# Patient Record
Sex: Male | Born: 1980 | Race: White | Hispanic: No | Marital: Single
Health system: Southern US, Community
[De-identification: ages and names within clinical notes are randomized; demographics above are authoritative.]

## PROBLEM LIST (undated history)

## (undated) DIAGNOSIS — F419 Anxiety disorder, unspecified: Secondary | ICD-10-CM

## (undated) DIAGNOSIS — E079 Disorder of thyroid, unspecified: Secondary | ICD-10-CM

## (undated) DIAGNOSIS — E039 Hypothyroidism, unspecified: Secondary | ICD-10-CM

---

## 2014-10-23 ENCOUNTER — Emergency Department (HOSPITAL_COMMUNITY)
Admission: EM | Admit: 2014-10-23 | Discharge: 2014-10-23 | Disposition: A | Discharge status: Home / Self Care | Payer: Self-pay | Attending: Emergency Medicine | Admitting: Emergency Medicine

## 2014-10-23 ENCOUNTER — Encounter (HOSPITAL_COMMUNITY): Payer: Self-pay | Admitting: Emergency Medicine

## 2014-10-23 DIAGNOSIS — Z8639 Personal history of other endocrine, nutritional and metabolic disease: Secondary | ICD-10-CM | POA: Insufficient documentation

## 2014-10-23 DIAGNOSIS — Z720 Tobacco use: Secondary | ICD-10-CM | POA: Insufficient documentation

## 2014-10-23 DIAGNOSIS — L03115 Cellulitis of right lower limb: Secondary | ICD-10-CM | POA: Insufficient documentation

## 2014-10-23 HISTORY — DX: Disorder of thyroid, unspecified: E07.9

## 2014-10-23 MED ORDER — CLINDAMYCIN HCL 150 MG PO CAPS: 450.0000 mg | ORAL_CAPSULE | Freq: Four times a day (QID) | ORAL | Status: DC

## 2014-10-23 NOTE — ED Notes (Signed)
Pt had poison oak on right lower leg, was exposed to MRSA by room mate, leg now red, swollen, yellow pus.

## 2014-10-23 NOTE — Discharge Instructions (Signed)
Abscess °An abscess (boil or furuncle) is an infected area on or under the skin. This area is filled with yellowish-white fluid (pus) and other material (debris). °HOME CARE  °· Only take medicines as told by your doctor. °· If you were given antibiotic medicine, take it as directed. Finish the medicine even if you start to feel better. °· If gauze is used, follow your doctor's directions for changing the gauze. °· To avoid spreading the infection: °¨ Keep your abscess covered with a bandage. °¨ Wash your hands well. °¨ Do not share personal care items, towels, or whirlpools with others. °¨ Avoid skin contact with others. °· Keep your skin and clothes clean around the abscess. °· Keep all doctor visits as told. °GET HELP RIGHT AWAY IF:  °· You have more pain, puffiness (swelling), or redness in the wound site. °· You have more fluid or blood coming from the wound site. °· You have muscle aches, chills, or you feel sick. °· You have a fever. °MAKE SURE YOU:  °· Understand these instructions. °· Will watch your condition. °· Will get help right away if you are not doing well or get worse. °Document Released: 07/05/2007 Document Revised: 07/18/2011 Document Reviewed: 03/31/2011 °[employer]® Patient Information ©2015 [employer]. This information is not intended to replace advice given to you by your health care provider. Make sure you discuss any questions you have with your health care provider. ° °

## 2014-10-23 NOTE — ED Provider Notes (Signed)
CSN: [id number]     Arrival date & time 10/23/14  2029 History  This chart was scribed for Melene Plan, DO by Phillis Haggis, ED Scribe. This patient was seen in room APA01/[facility]01 and patient care was started at 9:22 PM.   Chief Complaint  Patient presents with  . Wound Infection   The history is provided by the patient. No language interpreter was used.   HPI Comments: Tony Stephenson is a 34 y.o. male who presents to the Emergency Department complaining of a wound infection to the right lower leg onset two days ago. Pt states that he has poison oak to bilateral lower legs and noticed a "pimple" on top of the rash of his right lower leg. He states that he popped the whitehead and it has continued to hurt, swell, has redness, and yellow pus. Pt states that he has been using hydrogen peroxide and witch hazel on the area to no relief. Reports worsening pain with ambulation. Pt denies fever, chills, nausea or vomiting.   Past Medical History  Diagnosis Date  . Thyroid disease    History reviewed. No pertinent past surgical history. No family history on file. Social History  Substance Use Topics  . Smoking status: Heavy Tobacco Smoker -- 0.50 packs/day    Types: Cigarettes  . Smokeless tobacco: None  . Alcohol Use: No    Review of Systems  Constitutional: Negative for fever and chills.  HENT: Negative for congestion and rhinorrhea.   Eyes: Negative for redness and visual disturbance.  Respiratory: Negative for shortness of breath and wheezing.   Cardiovascular: Negative for chest pain and palpitations.  Gastrointestinal: Negative for nausea and vomiting.  Genitourinary: Negative for dysuria and urgency.  Musculoskeletal: Negative for myalgias and arthralgias.  Skin: Positive for rash and wound. Negative for pallor.  Neurological: Negative for dizziness and headaches.   Allergies  Review of patient's allergies indicates not on file.  Home Medications   Prior to Admission medications    Medication Sig Start Date End Date Taking? Authorizing Provider  clindamycin (CLEOCIN) 150 MG capsule Take 3 capsules (450 mg total) by mouth 4 (four) times daily. 10/23/14   Melene Plan, DO   BP 126/84 mmHg  Pulse 74  Temp(Src) 98.8 F (37.1 C) (Oral)  Resp 20  Ht  (1.727 m)  Wt 185 lb (83.915 kg)  BMI 28.14 kg/m2  SpO2 100% Physical Exam  Constitutional: She is oriented to person, place, and time. She appears well-developed and well-nourished.  HENT:  Head: Normocephalic and atraumatic.  Eyes: Conjunctivae and EOM are normal. Pupils are equal, round, and reactive to light.  Neck: Normal range of motion. Neck supple. No JVD present.  Cardiovascular: Normal rate, regular rhythm and normal heart sounds.  Exam reveals no gallop and no friction rub.   No murmur heard. Pulmonary/Chest: Effort normal and breath sounds normal. No respiratory distress. He has no wheezes.  Abdominal: Soft. He exhibits no distension. There is no tenderness. There is no rebound and no guarding.  Musculoskeletal: Normal range of motion.  serrous drainage, surrounding induration, draining ulceration to the lateral aspect of the proximal right calf, pulses, motor and sensation intact distally  Neurological: She is alert and oriented to person, place, and time.  Skin: Skin is warm and dry. No rash noted. No pallor.  Psychiatric: She has a normal mood and affect. Her behavior is normal.  Nursing note and vitals reviewed.   ED Course  Procedures (including critical care time) DIAGNOSTIC STUDIES:  Oxygen Saturation is 100% on RA, normal by my interpretation.    COORDINATION OF CARE: 9:23 PM-Discussed treatment plan which includes [place] of leg and anti-biotics with pt at bedside and pt agreed to plan.   Labs Review Labs Reviewed  CBC WITH DIFFERENTIAL/PLATELET  BASIC METABOLIC PANEL    Imaging Review No results found.    EKG Interpretation None      Emergency Focused Ultrasound Exam Limited  Ultrasound of Soft Tissue   Performed and interpreted by Dr. Adela Lank Indication: evaluation for infection or foreign body Transverse and Sagittal views of  are obtained in real time for the purposes of evaluation of skin and underlying soft tissues.  Findings: no heterogeneous fluid collection, with hyperemia/edema of surrounding tissue Interpretation: cellulitis Images archived electronically.  CPT Codes:   Lower extremity [id number]    MDM   Final diagnoses:  Cellulitis of right lower extremity    34 yo M with a chief complaint of skin infection. Sounds like probable abscess initially. Patient I&D to himself at home. Having swelling and drainage today. Denying fevers or chills. Bedside ultrasound without localized fluid collection. Will start on clindamycin. PCP follow-up. I personally performed the services described in this documentation, which was scribed in my presence. The recorded information has been reviewed and is accurate.    11:32 PM:  I have discussed the diagnosis/risks/treatment options with the patient and family and believe the pt to be eligible for discharge home to follow-up with PCP. We also discussed returning to the ED immediately if new or worsening sx occur. We discussed the sx which are most concerning (e.g., systemic symptoms) that necessitate immediate return. Medications administered to the patient during their visit and any new prescriptions provided to the patient are listed below.  Medications given during this visit Medications - No data to display  Discharge Medication List as of 10/23/2014 10:16 PM    START taking these medications   Details  clindamycin (CLEOCIN) 150 MG capsule Take 3 capsules (450 mg total) by mouth 4 (four) times daily., Starting 10/23/2014, Until Discontinued, Print         The patient appears reasonably screen and/or stabilized for discharge and I doubt any other medical condition or other [facility] requiring further screening,  evaluation, or treatment in the ED at this time prior to discharge.     Melene Plan, DO 10/23/14 2332

## 2015-07-05 ENCOUNTER — Encounter (HOSPITAL_COMMUNITY): Payer: Self-pay | Admitting: Emergency Medicine

## 2015-07-05 ENCOUNTER — Emergency Department (HOSPITAL_COMMUNITY)
Admission: EM | Admit: 2015-07-05 | Discharge: 2015-07-05 | Disposition: A | Discharge status: Home / Self Care | Payer: 59 | Attending: Emergency Medicine | Admitting: Emergency Medicine

## 2015-07-05 DIAGNOSIS — Z792 Long term (current) use of antibiotics: Secondary | ICD-10-CM | POA: Insufficient documentation

## 2015-07-05 DIAGNOSIS — L03116 Cellulitis of left lower limb: Secondary | ICD-10-CM

## 2015-07-05 DIAGNOSIS — Z87891 Personal history of nicotine dependence: Secondary | ICD-10-CM | POA: Diagnosis not present

## 2015-07-05 DIAGNOSIS — L02416 Cutaneous abscess of left lower limb: Secondary | ICD-10-CM | POA: Diagnosis present

## 2015-07-05 MED ORDER — CEPHALEXIN 500 MG PO CAPS: 500.0000 mg | ORAL_CAPSULE | Freq: Four times a day (QID) | ORAL | Status: DC

## 2015-07-05 MED ORDER — SULFAMETHOXAZOLE-TRIMETHOPRIM 800-160 MG PO TABS: 1.0000 | ORAL_TABLET | Freq: Two times a day (BID) | ORAL | Status: DC

## 2015-07-05 NOTE — ED Notes (Signed)
Pt c/o painful abscess superior to left knee onset 3 days ago. Yellow purulent open area approximately 1 cm diameter surrounded by hyperthermic erythematous skin approximately 10x6 cm. No fevers, chills, nausea, emesis.

## 2015-07-05 NOTE — ED Provider Notes (Signed)
CSN: [id number]     Arrival date & time 07/05/15  0903 History   First MD Initiated Contact with Patient 07/05/15 [phone]     Chief Complaint  Patient presents with  . Abscess     (Consider location/radiation/quality/duration/timing/severity/associated sxs/prior Treatment) Patient is a 35 y.o. male presenting with abscess. The history is provided by the patient.  Abscess Location:  Leg Leg abscess location:  L upper leg Abscess quality: draining   Red streaking: no   Duration:  2 days Progression:  Worsening Chronicity:  Recurrent Relieved by:  Nothing Worsened by:  Nothing tried Ineffective treatments:  None tried Associated symptoms: no fever, no headaches and no vomiting    35 yo M With a chief complaint of an abscess. The started on his leg couple days ago. Has been draining a little bit. Denies fevers or chills denies nausea. Has a history of a similar lesion in the past about 6 months ago. Denies any injury to the area. Thinks it started as an ingrown hair.  Past Medical History  Diagnosis Date  . Thyroid disease     Hypothyroidism   History reviewed. No pertinent past surgical history. History reviewed. No pertinent family history. Social History  Substance Use Topics  . Smoking status: Former Smoker -- 0.00 packs/day  . Smokeless tobacco: None  . Alcohol Use: No    Review of Systems  Constitutional: Negative for fever and chills.  HENT: Negative for congestion and facial swelling.   Eyes: Negative for discharge and visual disturbance.  Respiratory: Negative for shortness of breath.   Cardiovascular: Negative for chest pain and palpitations.  Gastrointestinal: Negative for vomiting, abdominal pain and diarrhea.  Musculoskeletal: Negative for myalgias and arthralgias.  Skin: Negative for color change and rash.  Neurological: Negative for tremors, syncope and headaches.  Psychiatric/Behavioral: Negative for confusion and dysphoric mood.      Allergies  Review  of patient's allergies indicates no known allergies.  Home Medications   Prior to Admission medications   Medication Sig Start Date End Date Taking? Authorizing Provider  cephALEXin (KEFLEX) 500 MG capsule Take 1 capsule (500 mg total) by mouth 4 (four) times daily. 07/05/15   Melene Planan Mikenzie Mccannon, DO  clindamycin (CLEOCIN) 150 MG capsule Take 3 capsules (450 mg total) by mouth 4 (four) times daily. 10/23/14   Melene Planan Casper Pagliuca, DO  sulfamethoxazole-trimethoprim (BACTRIM DS,SEPTRA DS) 800-160 MG tablet Take 1 tablet by mouth 2 (two) times daily. 07/05/15   Melene Planan Alvetta Hidrogo, DO   BP 119/83 mmHg  Pulse 90  Temp(Src) 98.2 F (36.8 C) (Oral)  Resp 18  Ht 5\' 8"  (1.727 m)  Wt 200 lb (90.719 kg)  BMI 30.42 kg/m2  SpO2 97% Physical Exam  Constitutional: He is oriented to person, place, and time. He appears well-developed and well-nourished.  HENT:  Head: Normocephalic and atraumatic.  Eyes: EOM are normal. Pupils are equal, round, and reactive to light.  Neck: Normal range of motion. Neck supple. No JVD present.  Cardiovascular: Normal rate and regular rhythm.  Exam reveals no gallop and no friction rub.   No murmur heard. Pulmonary/Chest: No respiratory distress. He has no wheezes.  Abdominal: He exhibits no distension. There is no rebound and no guarding.  Musculoskeletal: Normal range of motion. He exhibits edema.  About 3 finger breaths above the left patella there is significant induration with surrounding erythema. Is about the size of a salad plate. No noted fluctuance  Neurological: He is alert and oriented to person, place, and time.  Skin: No rash noted. No pallor.  Psychiatric: He has a normal mood and affect. His behavior is normal.  Nursing note and vitals reviewed.   ED Course  Procedures (including critical care time) Labs Review Labs Reviewed - No data to display  Imaging Review No results found. I have personally reviewed and evaluated these images and lab results as part of my medical  decision-making.   EKG Interpretation None      Emergency Focused Ultrasound Exam Limited Ultrasound of Soft Tissue   Performed and interpreted by Dr. Adela Lank Indication: evaluation for infection or foreign body Transverse and Sagittal views of left upper leg are obtained in real time for the purposes of evaluation of skin and underlying soft tissues.  Findings: no heterogeneous fluid collection, with hyperemia/edema of surrounding tissue Interpretation: no abscess, with cellulitis Images archived electronically.  CPT Codes:   Lower extremity [id number]    MDM   Final diagnoses:  Cellulitis of left lower extremity    35 yo M with a chief complaint of cellulitis to the left lower extremity. Bedside ultrasound with no drainable fluid collection. We'll have him use warm compresses. PCP follow-up.  10:01 AM:  I have discussed the diagnosis/risks/treatment options with the patient and believe the pt to be eligible for discharge home to follow-up with PCP. We also discussed returning to the ED immediately if new or worsening sx occur. We discussed the sx which are most concerning (e.g., sudden worsening pain, fever, inability to tolerate by mouth) that necessitate immediate return. Medications administered to the patient during their visit and any new prescriptions provided to the patient are listed below.  Medications given during this visit Medications - No data to display  New Prescriptions   CEPHALEXIN (KEFLEX) 500 MG CAPSULE    Take 1 capsule (500 mg total) by mouth 4 (four) times daily.   SULFAMETHOXAZOLE-TRIMETHOPRIM (BACTRIM DS,SEPTRA DS) 800-160 MG TABLET    Take 1 tablet by mouth 2 (two) times daily.    The patient appears reasonably screen and/or stabilized for discharge and I doubt any other medical condition or other [facility] requiring further screening, evaluation, or treatment in the ED at this time prior to discharge.    Melene Plan, DO 07/05/15 1001

## 2015-07-05 NOTE — Discharge Instructions (Signed)

## 2017-08-21 ENCOUNTER — Encounter (HOSPITAL_COMMUNITY): Payer: Self-pay | Admitting: Emergency Medicine

## 2017-08-21 ENCOUNTER — Emergency Department (HOSPITAL_COMMUNITY)
Admission: EM | Admit: 2017-08-21 | Discharge: 2017-08-21 | Disposition: A | Discharge status: Home / Self Care | Payer: 59 | Attending: Emergency Medicine | Admitting: Emergency Medicine

## 2017-08-21 DIAGNOSIS — Z87891 Personal history of nicotine dependence: Secondary | ICD-10-CM | POA: Insufficient documentation

## 2017-08-21 DIAGNOSIS — R1110 Vomiting, unspecified: Secondary | ICD-10-CM | POA: Diagnosis not present

## 2017-08-21 DIAGNOSIS — Z79899 Other long term (current) drug therapy: Secondary | ICD-10-CM | POA: Diagnosis not present

## 2017-08-21 DIAGNOSIS — E039 Hypothyroidism, unspecified: Secondary | ICD-10-CM | POA: Insufficient documentation

## 2017-08-21 DIAGNOSIS — N179 Acute kidney failure, unspecified: Secondary | ICD-10-CM | POA: Diagnosis not present

## 2017-08-21 DIAGNOSIS — R109 Unspecified abdominal pain: Secondary | ICD-10-CM | POA: Insufficient documentation

## 2017-08-21 HISTORY — DX: Anxiety disorder, unspecified: F41.9

## 2017-08-21 HISTORY — DX: Hypothyroidism, unspecified: E03.9

## 2017-08-21 LAB — CBC
HCT: 43.6 % (ref 39.0–52.0)
HEMOGLOBIN: 14.1 g/dL (ref 13.0–17.0)
MCH: 29.7 pg (ref 26.0–34.0)
MCHC: 32.3 g/dL (ref 30.0–36.0)
MCV: 91.8 fL (ref 78.0–100.0)
Platelets: 239 10*3/uL (ref 150–400)
RBC: 4.75 MIL/uL (ref 4.22–5.81)
RDW: 13.2 % (ref 11.5–15.5)
WBC: 9.4 10*3/uL (ref 4.0–10.5)

## 2017-08-21 LAB — URINALYSIS, ROUTINE W REFLEX MICROSCOPIC
Bilirubin Urine: NEGATIVE
Glucose, UA: NEGATIVE mg/dL
Hgb urine dipstick: NEGATIVE
KETONES UR: NEGATIVE mg/dL
Nitrite: NEGATIVE
PROTEIN: NEGATIVE mg/dL
Specific Gravity, Urine: 1.015 (ref 1.005–1.030)
pH: 5 (ref 5.0–8.0)

## 2017-08-21 LAB — COMPREHENSIVE METABOLIC PANEL
ALBUMIN: 4.3 g/dL (ref 3.5–5.0)
ALT: 23 U/L (ref 0–44)
ANION GAP: 9 (ref 5–15)
AST: 22 U/L (ref 15–41)
Alkaline Phosphatase: 47 U/L (ref 38–126)
BUN: 10 mg/dL (ref 6–20)
CHLORIDE: 106 mmol/L (ref 98–111)
CO2: 25 mmol/L (ref 22–32)
CREATININE: 1.71 mg/dL — AB (ref 0.61–1.24)
Calcium: 9.3 mg/dL (ref 8.9–10.3)
GFR calc Af Amer: 57 mL/min — ABNORMAL LOW (ref >60)
GFR calc non Af Amer: 49 mL/min — ABNORMAL LOW (ref >60)
Glucose, Bld: 110 mg/dL — ABNORMAL HIGH (ref 70–99)
Potassium: 3.6 mmol/L (ref 3.5–5.1)
SODIUM: 140 mmol/L (ref 135–145)
Total Bilirubin: 0.8 mg/dL (ref 0.3–1.2)
Total Protein: 6.9 g/dL (ref 6.5–8.1)

## 2017-08-21 LAB — LIPASE, BLOOD: LIPASE: 27 U/L (ref 11–51)

## 2017-08-21 MED ORDER — ONDANSETRON 4 MG PO TBDP
4.0000 mg | ORAL_TABLET | Freq: Once | ORAL | Status: AC | PRN
  Administered 2017-08-21: 4 mg via ORAL
  Filled 2017-08-21: qty 1

## 2017-08-21 MED ORDER — SODIUM CHLORIDE 0.9 % IV BOLUS: 1000.0000 mL | Freq: Once | INTRAVENOUS | Status: DC

## 2017-08-21 MED ORDER — ONDANSETRON 4 MG PO TBDP: 4.0000 mg | ORAL_TABLET | Freq: Three times a day (TID) | ORAL | 0 refills | Status: AC | PRN

## 2017-08-21 NOTE — ED Notes (Signed)
Pt stable, ambulatory, states understanding of discharge instructions 

## 2017-08-21 NOTE — Discharge Instructions (Signed)
Your kidney function was elevated today, you have refused IV fluids in the emergency department.  Please be sure to drink plenty of water to help improve your kidney function and follow-up with your primary doctor for further evaluation. Please follow-up with your primary care provider as soon as possible for further evaluation. Please return to the emergency department for any new or worsening symptoms. You may use the Zofran prescribed for nausea as needed. You may use over-the-counter anti-inflammatory medication such as Tylenol as needed for your pain.  Contact a health care provider if: Your pain is not controlled with medicine. You have new symptoms. Your pain gets worse. You have a fever. Your symptoms last longer than 2-3 days. You have trouble urinating or you are urinating very frequently. Get help right away if: You have trouble breathing or you are short of breath. Your abdomen hurts or it is swollen or red. You have nausea or vomiting. You feel faint or you pass out. You have blood in your urine. Contact a doctor if: You have a fever. You cannot keep fluids down. Your symptoms get worse. You have new symptoms. You feel sick to your stomach for more than two days. You feel light-headed or dizzy. You have a headache. You have muscle cramps. Get help right away if: You have pain in your chest, neck, arm, or jaw. You feel very weak or you pass out (faint). You throw up again and again. You see blood in your throw-up. Your throw-up looks like black coffee grounds. You have bloody or black poop (stools) or poop that look like tar. You have a very bad headache, a stiff neck, or both. You have a rash. You have very bad pain, cramping, or bloating in your belly (abdomen). You have trouble breathing. You are breathing very quickly. Your heart is beating very quickly. Your skin feels cold and clammy. You feel confused. You have pain when you pee. You have signs of  dehydration, such as: Dark pee, hardly any pee, or no pee. Cracked lips. Dry mouth. Sunken eyes. Sleepiness. Weakness.

## 2017-08-21 NOTE — ED Notes (Signed)
Pt refused IV and IV fluids at this time.  States he can drink water just fine.

## 2017-08-21 NOTE — ED Triage Notes (Signed)
Patient complains of intermittent stabbing left sided abdominal pain for the last two weeks. States pain has gotten progressively worse, states pain was too severe this morning so patient came in for evaluation. Patient alert, oriented, and ambulating independently with steady gait.

## 2017-08-21 NOTE — ED Provider Notes (Signed)
[facility] EMERGENCY DEPARTMENT Provider Note   CSN: [id number] Arrival date & time: 08/21/17  1008     History   Chief Complaint Chief Complaint  Patient presents with  . Abdominal Pain    HPI Tony Stephenson is a 37 y.o. male presenting for left-sided abdominal pain that began yesterday morning at 3:30 AM.  Patient describes his pain as a throbbing that begins in his left side and radiates to his back.  Patient states that the pain lasted approximately 2 hours and then he went to sleep and when he awoke without pain.  Patient states that his left sided abdominal pain has not returned since the initial event.  Patient also endorses intermittent vomiting for the past 2 weeks, he states that he has vomited approximately 4 times over the past 2 weeks, nonbloody and nonbilious.  Patient endorses a generalized abdominal pain that he attributes to his vomiting.  Patient states that he last vomited when the pain occurred 2 nights ago.  Patient states that he has eaten food and drink water without nausea or vomiting in the last 24 hours.  Patient denies fever, diarrhea, hematuria, penile discharge, testicular swelling or pain.    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 Patient denies personal or family history of kidney stones.  Past Medical History:  Diagnosis Date  . Anxiety   . Hypothyroidism   . Thyroid disease    Hypothyroidism    There are no active problems to display for this patient.   History reviewed. No pertinent surgical history.      Home Medications    Prior to Admission medications   Medication Sig Start Date End Date Taking? Authorizing Provider  cephALEXin (KEFLEX) 500 MG capsule Take 1 capsule (500 mg total) by mouth 4 (four) times daily. 07/05/15   Melene Plan, DO  clindamycin (CLEOCIN) 150 MG capsule Take 3 capsules (450 mg total) by mouth 4 (four) times daily. 10/23/14    Melene Plan, DO  sulfamethoxazole-trimethoprim (BACTRIM DS,SEPTRA DS) 800-160 MG tablet Take 1 tablet by mouth 2 (two) times daily. 07/05/15   Melene Plan, DO    Family History No family history on file.  Social History Social History   Tobacco Use  . Smoking status: Former Smoker    Packs/day: 0.00  Substance Use Topics  . Alcohol use: No  . Drug use: No     Allergies   Patient has no known allergies.   Review of Systems Review of Systems  Constitutional: Negative.  Negative for chills, fatigue and fever.  HENT: Negative.  Negative for rhinorrhea, sore throat and trouble swallowing.   Eyes: Negative.  Negative for visual disturbance.  Respiratory: Negative.  Negative for cough, chest tightness and shortness of breath.   Cardiovascular: Negative.  Negative for chest pain and leg swelling.  Gastrointestinal: Positive for abdominal pain, nausea and vomiting. Negative for blood in stool and diarrhea.  Genitourinary: Negative.  Negative for discharge, dysuria, flank pain, hematuria, scrotal swelling and testicular pain.  Musculoskeletal: Negative.  Negative for arthralgias, myalgias and neck pain.  Skin: Negative.  Negative for rash.  Neurological: Negative.  Negative for dizziness, syncope, weakness, light-headedness and headaches.     Physical Exam Updated Vital Signs BP 123/69   Pulse 70   Temp 99 F (37.2 C) (Oral)   Resp 17   Ht 5\' 8"  (1.727 m)   Wt 97.5 kg (215 lb)  SpO2 95%   BMI 32.69 kg/m   Physical Exam  Constitutional: He is oriented to person, place, and time. He appears well-developed and well-nourished. No distress.  HENT:  Head: Normocephalic and atraumatic.  Eyes: Pupils are equal, round, and reactive to light. EOM are normal.  Neck: Normal range of motion. Neck supple.  Cardiovascular: Normal rate, regular rhythm, normal heart sounds and intact distal pulses.  Pulmonary/Chest: Effort normal and breath sounds normal. No respiratory distress.    Abdominal: Soft. Normal appearance and bowel sounds are normal. He exhibits no distension. There is no tenderness. There is no rigidity, no rebound, no guarding, no CVA tenderness, no tenderness at McBurney's point and negative Murphy's sign.    Genitourinary:  Genitourinary Comments: Deferred by patient  Neurological: He is alert and oriented to person, place, and time.  Skin: Skin is warm and dry. Capillary refill takes less than 2 seconds.  Psychiatric: He has a normal mood and affect. His behavior is normal.     ED Treatments / Results  Labs (all labs ordered are listed, but only abnormal results are displayed) Labs Reviewed  COMPREHENSIVE METABOLIC PANEL - Abnormal; Notable for the following components:      Result Value   Glucose, Bld 110 (*)    Creatinine, Ser 1.71 (*)    GFR calc non Af Amer 49 (*)    GFR calc Af Amer 57 (*)    All other components within normal limits  LIPASE, BLOOD  CBC  URINALYSIS, ROUTINE W REFLEX MICROSCOPIC    EKG None  Radiology No results found.  Procedures Procedures (including critical care time)  Medications Ordered in ED Medications  sodium chloride 0.9 % bolus 1,000 mL (has no administration in time range)  ondansetron (ZOFRAN-ODT) disintegrating tablet 4 mg (4 mg Oral Given 08/21/17 1035)     Initial Impression / Assessment and Plan / ED Course  I have reviewed the triage vital signs and the nursing notes.  Pertinent labs & imaging results that were available during my care of the patient were reviewed by me and considered in my medical decision making (see chart for details).  Clinical Course as of Aug 21 1844  Tue Aug 21, 2017  1400 Patient is refusing IV fluids, states that he would rather replenish orally.  I have informed the patient that his kidney function was elevated today and that I recommended IV fluids, he still refused.   [BM]    Clinical Course User Index [BM] Bill SalinasMorelli, Lemar Bakos A, PA-C   Patient presenting  with abdominal pain. Patient is afebrile, non-toxic appearing, sitting comfortably on examination table.  Patient denying pain at this time, patient refused fluid bolus in the department.  Patient's labs showed a creatinine of 1.71 today, I advised patient to accept the fluid bolus, he declined and stated that he just wanted to continue drinking water, I have informed the patient that he needs to follow-up with his primary care provider for further evaluation to make sure that his increased creatinine level resolves. Patient does not meet the SIRS or Sepsis criteria.  On repeat exam patient does not have a surgical abdomen and there are no peritoneal signs.  No indication of appendicitis, bowel obstruction, bowel perforation, cholecystitis, diverticulitis.  It is possible that the patient's vomiting and pain are related to a possible kidney stone.  I have encouraged patient to follow-up with his primary care provider for further evaluation.  Patient with no signs of infection, no pain at this time,  tolerating oral liquids and solids without difficulty, patient without CVA tenderness, ambulating well and requesting discharge.  I have informed the patient of his need for further evaluation through his primary care provider and he states understanding and states that he will follow-up with his primary care provider as soon as he can.  At this time there does not appear to be any evidence of an acute emergency medical condition and the patient appears stable for discharge with appropriate outpatient follow up. Diagnosis was discussed with patient who verbalizes understanding and is agreeable to discharge. I have discussed return precautions with patient who verbalize understanding of return precautions. Patient strongly encouraged to follow-up with their PCP.  Patient's case discussed with Dr. Adela Lank who agrees with plan to discharge with follow-up.     This note was dictated using DragonOne dictation software;  please contact for any inconsistencies within the note.     Final Clinical Impressions(s) / ED Diagnoses   Final diagnoses:  None    ED Discharge Orders    None       Bill Salinas, PA-C 08/21/17 1858    Melene Plan, DO 08/21/17 2046

## 2018-09-30 ENCOUNTER — Emergency Department (HOSPITAL_COMMUNITY): Payer: No Typology Code available for payment source

## 2018-09-30 ENCOUNTER — Inpatient Hospital Stay (HOSPITAL_COMMUNITY): Payer: No Typology Code available for payment source

## 2018-09-30 ENCOUNTER — Inpatient Hospital Stay (HOSPITAL_COMMUNITY)
Admission: EM | Admit: 2018-09-30 | Discharge: 2018-12-01 | DRG: 551 | Disposition: E | Payer: No Typology Code available for payment source | Attending: General Surgery | Admitting: General Surgery

## 2018-09-30 DIAGNOSIS — R578 Other shock: Secondary | ICD-10-CM | POA: Diagnosis not present

## 2018-09-30 DIAGNOSIS — Y92410 Unspecified street and highway as the place of occurrence of the external cause: Secondary | ICD-10-CM | POA: Diagnosis not present

## 2018-09-30 DIAGNOSIS — J9601 Acute respiratory failure with hypoxia: Secondary | ICD-10-CM | POA: Diagnosis present

## 2018-09-30 DIAGNOSIS — S40212A Abrasion of left shoulder, initial encounter: Secondary | ICD-10-CM | POA: Diagnosis present

## 2018-09-30 DIAGNOSIS — S20229A Contusion of unspecified back wall of thorax, initial encounter: Secondary | ICD-10-CM | POA: Diagnosis not present

## 2018-09-30 DIAGNOSIS — S41039A Puncture wound without foreign body of unspecified shoulder, initial encounter: Secondary | ICD-10-CM | POA: Diagnosis present

## 2018-09-30 DIAGNOSIS — E872 Acidosis: Secondary | ICD-10-CM | POA: Diagnosis present

## 2018-09-30 DIAGNOSIS — D62 Acute posthemorrhagic anemia: Secondary | ICD-10-CM | POA: Diagnosis present

## 2018-09-30 DIAGNOSIS — R402310 Coma scale, best motor response, none, unspecified time: Secondary | ICD-10-CM | POA: Diagnosis present

## 2018-09-30 DIAGNOSIS — J15212 Pneumonia due to Methicillin resistant Staphylococcus aureus: Secondary | ICD-10-CM | POA: Diagnosis not present

## 2018-09-30 DIAGNOSIS — Z515 Encounter for palliative care: Secondary | ICD-10-CM | POA: Diagnosis not present

## 2018-09-30 DIAGNOSIS — G8250 Quadriplegia, unspecified: Secondary | ICD-10-CM | POA: Diagnosis present

## 2018-09-30 DIAGNOSIS — R069 Unspecified abnormalities of breathing: Secondary | ICD-10-CM

## 2018-09-30 DIAGNOSIS — S12300A Unspecified displaced fracture of fourth cervical vertebra, initial encounter for closed fracture: Secondary | ICD-10-CM | POA: Diagnosis present

## 2018-09-30 DIAGNOSIS — I462 Cardiac arrest due to underlying cardiac condition: Secondary | ICD-10-CM | POA: Diagnosis not present

## 2018-09-30 DIAGNOSIS — S129XXA Fracture of neck, unspecified, initial encounter: Principal | ICD-10-CM | POA: Diagnosis present

## 2018-09-30 DIAGNOSIS — E876 Hypokalemia: Secondary | ICD-10-CM | POA: Diagnosis present

## 2018-09-30 DIAGNOSIS — I469 Cardiac arrest, cause unspecified: Secondary | ICD-10-CM

## 2018-09-30 DIAGNOSIS — R0902 Hypoxemia: Secondary | ICD-10-CM

## 2018-09-30 DIAGNOSIS — E870 Hyperosmolality and hypernatremia: Secondary | ICD-10-CM | POA: Diagnosis not present

## 2018-09-30 DIAGNOSIS — W3400XA Accidental discharge from unspecified firearms or gun, initial encounter: Secondary | ICD-10-CM | POA: Diagnosis not present

## 2018-09-30 DIAGNOSIS — S41032A Puncture wound without foreign body of left shoulder, initial encounter: Secondary | ICD-10-CM | POA: Diagnosis present

## 2018-09-30 DIAGNOSIS — R402210 Coma scale, best verbal response, none, unspecified time: Secondary | ICD-10-CM | POA: Diagnosis present

## 2018-09-30 DIAGNOSIS — Z23 Encounter for immunization: Secondary | ICD-10-CM | POA: Diagnosis not present

## 2018-09-30 DIAGNOSIS — R339 Retention of urine, unspecified: Secondary | ICD-10-CM | POA: Diagnosis present

## 2018-09-30 DIAGNOSIS — J189 Pneumonia, unspecified organism: Secondary | ICD-10-CM

## 2018-09-30 DIAGNOSIS — L8990 Pressure ulcer of unspecified site, unspecified stage: Secondary | ICD-10-CM | POA: Diagnosis not present

## 2018-09-30 DIAGNOSIS — J80 Acute respiratory distress syndrome: Secondary | ICD-10-CM

## 2018-09-30 DIAGNOSIS — J9690 Respiratory failure, unspecified, unspecified whether with hypoxia or hypercapnia: Secondary | ICD-10-CM

## 2018-09-30 DIAGNOSIS — R402110 Coma scale, eyes open, never, unspecified time: Secondary | ICD-10-CM | POA: Diagnosis present

## 2018-09-30 DIAGNOSIS — Z01818 Encounter for other preprocedural examination: Secondary | ICD-10-CM

## 2018-09-30 DIAGNOSIS — Z9289 Personal history of other medical treatment: Secondary | ICD-10-CM

## 2018-09-30 DIAGNOSIS — J156 Pneumonia due to other aerobic Gram-negative bacteria: Secondary | ICD-10-CM | POA: Diagnosis present

## 2018-09-30 DIAGNOSIS — Y249XXA Unspecified firearm discharge, undetermined intent, initial encounter: Secondary | ICD-10-CM

## 2018-09-30 DIAGNOSIS — Z20828 Contact with and (suspected) exposure to other viral communicable diseases: Secondary | ICD-10-CM | POA: Diagnosis present

## 2018-09-30 DIAGNOSIS — K567 Ileus, unspecified: Secondary | ICD-10-CM

## 2018-09-30 DIAGNOSIS — Z79890 Hormone replacement therapy (postmenopausal): Secondary | ICD-10-CM

## 2018-09-30 DIAGNOSIS — Z79899 Other long term (current) drug therapy: Secondary | ICD-10-CM

## 2018-09-30 DIAGNOSIS — R001 Bradycardia, unspecified: Secondary | ICD-10-CM | POA: Diagnosis not present

## 2018-09-30 DIAGNOSIS — Z4659 Encounter for fitting and adjustment of other gastrointestinal appliance and device: Secondary | ICD-10-CM

## 2018-09-30 DIAGNOSIS — Z0189 Encounter for other specified special examinations: Secondary | ICD-10-CM

## 2018-09-30 LAB — CBC
HCT: 40.7 % (ref 39.0–52.0)
Hemoglobin: 12.8 g/dL — ABNORMAL LOW (ref 13.0–17.0)
MCH: 30.7 pg (ref 26.0–34.0)
MCHC: 31.4 g/dL (ref 30.0–36.0)
MCV: 97.6 fL (ref 80.0–100.0)
Platelets: 235 K/uL (ref 150–400)
RBC: 4.17 MIL/uL — ABNORMAL LOW (ref 4.22–5.81)
RDW: 13 % (ref 11.5–15.5)
WBC: 13.6 K/uL — ABNORMAL HIGH (ref 4.0–10.5)
nRBC: 0 % (ref 0.0–0.2)

## 2018-09-30 LAB — COMPREHENSIVE METABOLIC PANEL
ALT: 44 U/L (ref 0–44)
AST: 47 U/L — ABNORMAL HIGH (ref 15–41)
Albumin: 3.8 g/dL (ref 3.5–5.0)
Alkaline Phosphatase: 61 U/L (ref 38–126)
Anion gap: 10 (ref 5–15)
BUN: 19 mg/dL (ref 6–20)
CO2: 25 mmol/L (ref 22–32)
Calcium: 8.5 mg/dL — ABNORMAL LOW (ref 8.9–10.3)
Chloride: 106 mmol/L (ref 98–111)
Creatinine, Ser: 1.23 mg/dL (ref 0.61–1.24)
GFR calc Af Amer: >60 mL/min (ref >60)
GFR calc non Af Amer: >60 mL/min (ref >60)
Glucose, Bld: 197 mg/dL — ABNORMAL HIGH (ref 70–99)
Potassium: 3.6 mmol/L (ref 3.5–5.1)
Sodium: 141 mmol/L (ref 135–145)
Total Bilirubin: 0.4 mg/dL (ref 0.3–1.2)
Total Protein: 6.6 g/dL (ref 6.5–8.1)

## 2018-09-30 LAB — I-STAT CHEM 8, ED
BUN: 27 mg/dL — ABNORMAL HIGH (ref 6–20)
Calcium, Ion: 1.13 mmol/L — ABNORMAL LOW (ref 1.15–1.40)
Chloride: 109 mmol/L (ref 98–111)
Creatinine, Ser: 1.1 mg/dL (ref 0.61–1.24)
Glucose, Bld: 192 mg/dL — ABNORMAL HIGH (ref 70–99)
HCT: 39 % (ref 39.0–52.0)
Hemoglobin: 13.3 g/dL (ref 13.0–17.0)
Potassium: 3.9 mmol/L (ref 3.5–5.1)
Sodium: 141 mmol/L (ref 135–145)
TCO2: 24 mmol/L (ref 22–32)

## 2018-09-30 LAB — PREPARE FRESH FROZEN PLASMA
Unit division: 0
Unit division: 0

## 2018-09-30 LAB — BPAM FFP
Blood Product Expiration Date: 202009012359
Blood Product Expiration Date: 202009012359
ISSUE DATE / TIME: 202008310740
ISSUE DATE / TIME: 202008310740
Unit Type and Rh: 600
Unit Type and Rh: 600

## 2018-09-30 LAB — URINALYSIS, ROUTINE W REFLEX MICROSCOPIC
Bilirubin Urine: NEGATIVE
Glucose, UA: NEGATIVE mg/dL
Hgb urine dipstick: NEGATIVE
Ketones, ur: NEGATIVE mg/dL
Leukocytes,Ua: NEGATIVE
Nitrite: NEGATIVE
Protein, ur: NEGATIVE mg/dL
Specific Gravity, Urine: >1.046 — ABNORMAL HIGH (ref 1.005–1.030)
pH: 6 (ref 5.0–8.0)

## 2018-09-30 LAB — POCT I-STAT 7, (LYTES, BLD GAS, ICA,H+H)
Acid-base deficit: 1 mmol/L (ref 0.0–2.0)
Bicarbonate: 25.1 mmol/L (ref 20.0–28.0)
Calcium, Ion: 1.19 mmol/L (ref 1.15–1.40)
HCT: 30 % — ABNORMAL LOW (ref 39.0–52.0)
Hemoglobin: 10.2 g/dL — ABNORMAL LOW (ref 13.0–17.0)
O2 Saturation: 100 %
Patient temperature: 96.8
Potassium: 3.6 mmol/L (ref 3.5–5.1)
Sodium: 141 mmol/L (ref 135–145)
TCO2: 26 mmol/L (ref 22–32)
pCO2 arterial: 43.5 mmHg (ref 32.0–48.0)
pH, Arterial: 7.365 (ref 7.350–7.450)
pO2, Arterial: 373 mmHg — ABNORMAL HIGH (ref 83.0–108.0)

## 2018-09-30 LAB — RAPID URINE DRUG SCREEN, HOSP PERFORMED
Amphetamines: NOT DETECTED
Barbiturates: NOT DETECTED
Benzodiazepines: NOT DETECTED
Cocaine: NOT DETECTED
Opiates: NOT DETECTED
Tetrahydrocannabinol: POSITIVE — AB

## 2018-09-30 LAB — ETHANOL: Alcohol, Ethyl (B): <10 mg/dL (ref <10)

## 2018-09-30 LAB — SARS CORONAVIRUS 2 BY RT PCR (HOSPITAL ORDER, PERFORMED IN ~~LOC~~ HOSPITAL LAB): SARS Coronavirus 2: NEGATIVE

## 2018-09-30 LAB — BLOOD PRODUCT ORDER (VERBAL) VERIFICATION

## 2018-09-30 LAB — LACTIC ACID, PLASMA: Lactic Acid, Venous: 4.8 mmol/L (ref 0.5–1.9)

## 2018-09-30 LAB — PROTIME-INR
INR: 1 (ref 0.8–1.2)
Prothrombin Time: 13 s (ref 11.4–15.2)

## 2018-09-30 LAB — MRSA PCR SCREENING: MRSA by PCR: NEGATIVE

## 2018-09-30 LAB — CDS SEROLOGY

## 2018-09-30 MED ORDER — ALBUMIN HUMAN 5 % IV SOLN
INTRAVENOUS | Status: AC
  Administered 2018-09-30: 18:00:00 12.5 g via INTRAVENOUS
  Filled 2018-09-30: qty 500

## 2018-09-30 MED ORDER — CHLORHEXIDINE GLUCONATE CLOTH 2 % EX PADS
6.0000 | MEDICATED_PAD | Freq: Every day | CUTANEOUS | Status: DC
  Administered 2018-09-30 – 2018-10-31 (×28): 6 via TOPICAL

## 2018-09-30 MED ORDER — PROPOFOL 1000 MG/100ML IV EMUL
INTRAVENOUS | Status: AC
  Administered 2018-09-30: 08:00:00
  Filled 2018-09-30: qty 100

## 2018-09-30 MED ORDER — MIDAZOLAM HCL 2 MG/2ML IJ SOLN
INTRAMUSCULAR | Status: AC
  Administered 2018-09-30: 4 mg
  Filled 2018-09-30: qty 2

## 2018-09-30 MED ORDER — HYDRALAZINE HCL 20 MG/ML IJ SOLN: 10.0000 mg | INTRAMUSCULAR | Status: DC | PRN

## 2018-09-30 MED ORDER — CHLORHEXIDINE GLUCONATE 0.12% ORAL RINSE (MEDLINE KIT)
15.0000 mL | Freq: Two times a day (BID) | OROMUCOSAL | Status: DC
  Administered 2018-09-30 – 2018-11-01 (×65): 15 mL via OROMUCOSAL

## 2018-09-30 MED ORDER — PANTOPRAZOLE SODIUM 40 MG PO TBEC
40.0000 mg | DELAYED_RELEASE_TABLET | Freq: Every day | ORAL | Status: DC
  Administered 2018-10-04 – 2018-10-05 (×2): 40 mg via ORAL
  Filled 2018-09-30 (×4): qty 1

## 2018-09-30 MED ORDER — MIDAZOLAM HCL 2 MG/2ML IJ SOLN
INTRAMUSCULAR | Status: AC
  Filled 2018-09-30: qty 2

## 2018-09-30 MED ORDER — PHENYLEPHRINE HCL-NACL 10-0.9 MG/250ML-% IV SOLN
0.0000 ug/min | INTRAVENOUS | Status: DC
  Administered 2018-10-01: 20 ug/min via INTRAVENOUS
  Administered 2018-10-01: 10 ug/min via INTRAVENOUS
  Filled 2018-09-30 (×3): qty 250

## 2018-09-30 MED ORDER — MIDAZOLAM HCL 2 MG/2ML IJ SOLN: 2.0000 mg | INTRAMUSCULAR | Status: DC | PRN

## 2018-09-30 MED ORDER — ALBUMIN HUMAN 25 % IV SOLN
12.5000 g | Freq: Once | INTRAVENOUS | Status: AC
  Administered 2018-09-30: 12.5 g via INTRAVENOUS
  Filled 2018-09-30: qty 50

## 2018-09-30 MED ORDER — MIDAZOLAM 50MG/50ML (1MG/ML) PREMIX INFUSION
0.0000 mg/h | INTRAVENOUS | Status: DC
  Administered 2018-09-30: 2 mg/h via INTRAVENOUS
  Administered 2018-10-01: 15:00:00 3 mg/h via INTRAVENOUS
  Administered 2018-10-01: 01:00:00 4 mg/h via INTRAVENOUS
  Administered 2018-10-02 – 2018-10-03 (×3): 3 mg/h via INTRAVENOUS
  Administered 2018-10-04: 07:00:00 2 mg/h via INTRAVENOUS
  Administered 2018-10-05: 4 mg/h via INTRAVENOUS
  Administered 2018-10-05: 3 mg/h via INTRAVENOUS
  Administered 2018-10-06: 5 mg/h via INTRAVENOUS
  Administered 2018-10-06 (×2): 4 mg/h via INTRAVENOUS
  Administered 2018-10-07 (×3): 3 mg/h via INTRAVENOUS
  Administered 2018-10-08: 7 mg/h via INTRAVENOUS
  Administered 2018-10-08: 13:00:00 4 mg/h via INTRAVENOUS
  Administered 2018-10-09 (×2): 7 mg/h via INTRAVENOUS
  Administered 2018-10-10: 2 mg/h via INTRAVENOUS
  Filled 2018-09-30 (×20): qty 50

## 2018-09-30 MED ORDER — IOHEXOL 350 MG/ML SOLN
100.0000 mL | Freq: Once | INTRAVENOUS | Status: AC | PRN
  Administered 2018-09-30: 09:00:00 100 mL via INTRAVENOUS

## 2018-09-30 MED ORDER — FENTANYL 2500MCG IN NS 250ML (10MCG/ML) PREMIX INFUSION
0.0000 ug/h | INTRAVENOUS | Status: DC
  Administered 2018-09-30: 20 ug/h via INTRAVENOUS
  Administered 2018-10-01: 200 ug/h via INTRAVENOUS
  Administered 2018-10-01 – 2018-10-02 (×2): 150 ug/h via INTRAVENOUS
  Administered 2018-10-02: 200 ug/h via INTRAVENOUS
  Administered 2018-10-03: 75 ug/h via INTRAVENOUS
  Administered 2018-10-04: 250 ug/h via INTRAVENOUS
  Administered 2018-10-04: 300 ug/h via INTRAVENOUS
  Administered 2018-10-05: 200 ug/h via INTRAVENOUS
  Administered 2018-10-05: 275 ug/h via INTRAVENOUS
  Administered 2018-10-06 (×2): 250 ug/h via INTRAVENOUS
  Administered 2018-10-06: 300 ug/h via INTRAVENOUS
  Administered 2018-10-07 (×3): 250 ug/h via INTRAVENOUS
  Administered 2018-10-08: 400 ug/h via INTRAVENOUS
  Administered 2018-10-08: 300 ug/h via INTRAVENOUS
  Administered 2018-10-08 – 2018-10-11 (×11): 400 ug/h via INTRAVENOUS
  Administered 2018-10-12: 300 ug/h via INTRAVENOUS
  Administered 2018-10-12 – 2018-10-15 (×9): 400 ug/h via INTRAVENOUS
  Administered 2018-10-15: 14:00:00 325 ug/h via INTRAVENOUS
  Administered 2018-10-15 – 2018-10-17 (×7): 400 ug/h via INTRAVENOUS
  Administered 2018-10-17: 200 ug/h via INTRAVENOUS
  Administered 2018-10-18 – 2018-10-27 (×33): 400 ug/h via INTRAVENOUS
  Administered 2018-10-28 – 2018-10-31 (×11): 300 ug/h via INTRAVENOUS
  Administered 2018-11-01: 10:00:00 250 ug/h via INTRAVENOUS
  Administered 2018-11-01: 300 ug/h via INTRAVENOUS
  Filled 2018-09-30 (×101): qty 250

## 2018-09-30 MED ORDER — ORAL CARE MOUTH RINSE
15.0000 mL | OROMUCOSAL | Status: DC
  Administered 2018-09-30 – 2018-11-01 (×316): 15 mL via OROMUCOSAL

## 2018-09-30 MED ORDER — MIDAZOLAM BOLUS VIA INFUSION
1.0000 mg | INTRAVENOUS | Status: DC | PRN
  Filled 2018-09-30: qty 2

## 2018-09-30 MED ORDER — SODIUM CHLORIDE 0.9 % IV SOLN
INTRAVENOUS | Status: DC
  Administered 2018-09-30 – 2018-10-12 (×16): via INTRAVENOUS

## 2018-09-30 MED ORDER — ONDANSETRON 4 MG PO TBDP: 4.0000 mg | ORAL_TABLET | Freq: Four times a day (QID) | ORAL | Status: DC | PRN

## 2018-09-30 MED ORDER — MIDAZOLAM 50MG/50ML (1MG/ML) PREMIX INFUSION
0.5000 mg/h | INTRAVENOUS | Status: DC
  Administered 2018-09-30: 12:00:00 0.5 mg/h via INTRAVENOUS
  Filled 2018-09-30: qty 50

## 2018-09-30 MED ORDER — PANTOPRAZOLE SODIUM 40 MG IV SOLR
40.0000 mg | Freq: Every day | INTRAVENOUS | Status: DC
  Administered 2018-09-30 – 2018-10-08 (×7): 40 mg via INTRAVENOUS
  Filled 2018-09-30 (×7): qty 40

## 2018-09-30 MED ORDER — ONDANSETRON HCL 4 MG/2ML IJ SOLN
4.0000 mg | Freq: Four times a day (QID) | INTRAMUSCULAR | Status: DC | PRN
  Filled 2018-09-30: qty 2

## 2018-09-30 MED ORDER — ALBUMIN HUMAN 5 % IV SOLN
12.5000 g | Freq: Once | INTRAVENOUS | Status: AC
  Administered 2018-09-30: 18:00:00 12.5 g via INTRAVENOUS

## 2018-09-30 MED ORDER — PROPOFOL 1000 MG/100ML IV EMUL
0.0000 ug/kg/min | INTRAVENOUS | Status: DC
  Administered 2018-09-30: 10:00:00 40 ug/kg/min via INTRAVENOUS
  Administered 2018-09-30: 21:00:00 30 ug/kg/min via INTRAVENOUS
  Administered 2018-09-30: 12:00:00 40 ug/kg/min via INTRAVENOUS
  Administered 2018-10-01: 10 ug/kg/min via INTRAVENOUS
  Administered 2018-10-01: 02:00:00 30 ug/kg/min via INTRAVENOUS
  Administered 2018-10-01: 20 ug/kg/min via INTRAVENOUS
  Administered 2018-10-02 (×2): 10 ug/kg/min via INTRAVENOUS
  Administered 2018-10-03: 20 ug/kg/min via INTRAVENOUS
  Administered 2018-10-03: 09:00:00 15 ug/kg/min via INTRAVENOUS
  Administered 2018-10-04: 25 ug/kg/min via INTRAVENOUS
  Administered 2018-10-04: 30 ug/kg/min via INTRAVENOUS
  Administered 2018-10-04: 05:00:00 20 ug/kg/min via INTRAVENOUS
  Administered 2018-10-05: 05:00:00 40 ug/kg/min via INTRAVENOUS
  Administered 2018-10-05 – 2018-10-07 (×9): 50 ug/kg/min via INTRAVENOUS
  Administered 2018-10-07: 11:00:00 40 ug/kg/min via INTRAVENOUS
  Administered 2018-10-08 (×2): 50 ug/kg/min via INTRAVENOUS
  Filled 2018-09-30 (×3): qty 100
  Filled 2018-09-30 (×3): qty 200
  Filled 2018-09-30 (×20): qty 100

## 2018-09-30 NOTE — Progress Notes (Signed)
RT NOTES: Order from MD to advance endotracheal tube x 6cm. ETT advanced and now measures 27cm at the lip.

## 2018-09-30 NOTE — Procedures (Signed)
Intubation Procedure Note: ETT changed over tube changer.  Tony Stephenson  [id number]  07/21/1980    Procedure: Intubation Indications: Airway protection and maintenance   Procedure Details Consent: Risks of procedure as well as the alternatives and risks of each were explained to the (patient/caregiver).  Consent for procedure obtained. Time Out: Performed   Maximum clean technique was used including gloves, hand hygiene and mask  Laryngoscopy equipment used: None, but was available immediately.    Medications: Fentanyl infusion, Versed 6 mg, Propofol infusion      Evaluation Hemodynamic Status: BP stable throughout, stable throughout Patient's Current Condition: stable Patient did tolerate procedure well Chest X-ray ordered to verify placement.  pending  Georgann Housekeeper, AGACNP-BC [place] Pager [phone] or [phone]  Oct 27, 2018 12:16 PM
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
## 2018-09-30 NOTE — Progress Notes (Signed)
Patient turned for CSI to obtain pictures. Patient desated into the low 70's. Patient placed back on back, HOB increased, Oral and ETT suctioned with minimal secretions noted. RT notified, placed on 100% FiO@ 100%. Will continue to monitor. [name] RN BSN.
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
## 2018-09-30 NOTE — Progress Notes (Signed)
RT NOTE: RT x2 assisted NP with ETT exchange. RN and MD at bedside as well. No complications and VS stable. RT will continue to monitor.

## 2018-09-30 NOTE — Progress Notes (Signed)
   09/29/2018 1100  Clinical Encounter Type  Visited With Family  Visit Type Initial  Referral From Nurse  Consult/Referral To Chaplain  Spiritual Encounters  Spiritual Needs Prayer;Emotional  Stress Factors  Patient Stress Factors Not reviewed  Family Stress Factors Health changes;Major life changes    Chaplain visited after referral from the nurse.  Chaplain witnessed the family in the room emotionally distraught while talking to the doctor and nurse identified that they were going to request a chaplain.  The patients mother was having difficulties with the diagnosis and prognosis of the patient.  The chaplain offered prayer and sat with the mother while she processed the news that she had just received.  The chaplain also helped the patient with contacting additional family members.  Chaplain will follow-up as needed.  Brion Aliment Chaplain Resident For questions concerning this note please contact me by pager [phone]
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
## 2018-10-01 ENCOUNTER — Inpatient Hospital Stay: Payer: Self-pay

## 2018-10-01 LAB — PROTIME-INR
INR: 1.1 (ref 0.8–1.2)
Prothrombin Time: 14.4 seconds (ref 11.4–15.2)

## 2018-10-01 LAB — COMPREHENSIVE METABOLIC PANEL
ALT: 36 U/L (ref 0–44)
AST: 45 U/L — ABNORMAL HIGH (ref 15–41)
Albumin: 3.5 g/dL (ref 3.5–5.0)
Alkaline Phosphatase: 40 U/L (ref 38–126)
Anion gap: 7 (ref 5–15)
BUN: 15 mg/dL (ref 6–20)
CO2: 20 mmol/L — ABNORMAL LOW (ref 22–32)
Calcium: 8.6 mg/dL — ABNORMAL LOW (ref 8.9–10.3)
Chloride: 113 mmol/L — ABNORMAL HIGH (ref 98–111)
Creatinine, Ser: 0.79 mg/dL (ref 0.61–1.24)
GFR calc Af Amer: >60 mL/min (ref >60)
GFR calc non Af Amer: >60 mL/min (ref >60)
Glucose, Bld: 134 mg/dL — ABNORMAL HIGH (ref 70–99)
Potassium: 3.5 mmol/L (ref 3.5–5.1)
Sodium: 140 mmol/L (ref 135–145)
Total Bilirubin: 0.8 mg/dL (ref 0.3–1.2)
Total Protein: 5.5 g/dL — ABNORMAL LOW (ref 6.5–8.1)

## 2018-10-01 LAB — CBC
HCT: 30.3 % — ABNORMAL LOW (ref 39.0–52.0)
Hemoglobin: 10.2 g/dL — ABNORMAL LOW (ref 13.0–17.0)
MCH: 30.8 pg (ref 26.0–34.0)
MCHC: 33.7 g/dL (ref 30.0–36.0)
MCV: 91.5 fL (ref 80.0–100.0)
Platelets: 153 10*3/uL (ref 150–400)
RBC: 3.31 MIL/uL — ABNORMAL LOW (ref 4.22–5.81)
RDW: 13.1 % (ref 11.5–15.5)
WBC: 11.5 10*3/uL — ABNORMAL HIGH (ref 4.0–10.5)
nRBC: 0 % (ref 0.0–0.2)

## 2018-10-01 LAB — POCT I-STAT 7, (LYTES, BLD GAS, ICA,H+H)
Acid-base deficit: 4 mmol/L — ABNORMAL HIGH (ref 0.0–2.0)
Bicarbonate: 20.6 mmol/L (ref 20.0–28.0)
Calcium, Ion: 1.26 mmol/L (ref 1.15–1.40)
HCT: 27 % — ABNORMAL LOW (ref 39.0–52.0)
Hemoglobin: 9.2 g/dL — ABNORMAL LOW (ref 13.0–17.0)
O2 Saturation: 99 %
Patient temperature: 98.6
Potassium: 3.6 mmol/L (ref 3.5–5.1)
Sodium: 144 mmol/L (ref 135–145)
TCO2: 22 mmol/L (ref 22–32)
pCO2 arterial: 35.5 mmHg (ref 32.0–48.0)
pH, Arterial: 7.371 (ref 7.350–7.450)
pO2, Arterial: 129 mmHg — ABNORMAL HIGH (ref 83.0–108.0)

## 2018-10-01 LAB — LACTIC ACID, PLASMA: Lactic Acid, Venous: 1.8 mmol/L (ref 0.5–1.9)

## 2018-10-01 LAB — GLUCOSE, CAPILLARY: Glucose-Capillary: 134 mg/dL — ABNORMAL HIGH (ref 70–99)

## 2018-10-01 LAB — HIV ANTIBODY (ROUTINE TESTING W REFLEX): HIV Screen 4th Generation wRfx: NONREACTIVE

## 2018-10-01 LAB — MAGNESIUM: Magnesium: 1.7 mg/dL (ref 1.7–2.4)

## 2018-10-01 LAB — TRIGLYCERIDES: Triglycerides: 92 mg/dL (ref <150)

## 2018-10-01 LAB — ABO/RH: ABO/RH(D): O POS

## 2018-10-01 LAB — PHOSPHORUS: Phosphorus: 3.5 mg/dL (ref 2.5–4.6)

## 2018-10-01 MED ORDER — QUETIAPINE FUMARATE 25 MG PO TABS
50.0000 mg | ORAL_TABLET | Freq: Two times a day (BID) | ORAL | Status: DC
  Administered 2018-10-01 (×2): 50 mg
  Filled 2018-10-01 (×2): qty 2

## 2018-10-01 MED ORDER — SODIUM CHLORIDE 0.9% FLUSH: 10.0000 mL | INTRAVENOUS | Status: DC | PRN

## 2018-10-01 MED ORDER — OXYCODONE HCL 5 MG/5ML PO SOLN
10.0000 mg | Freq: Four times a day (QID) | ORAL | Status: DC
  Administered 2018-10-01 – 2018-11-01 (×116): 10 mg
  Filled 2018-10-01 (×116): qty 10

## 2018-10-01 MED ORDER — CHLORHEXIDINE GLUCONATE CLOTH 2 % EX PADS
6.0000 | MEDICATED_PAD | Freq: Every day | CUTANEOUS | Status: DC
  Administered 2018-10-03: 6 via TOPICAL

## 2018-10-01 MED ORDER — PIVOT 1.5 CAL PO LIQD
1000.0000 mL | ORAL | Status: DC
  Administered 2018-10-01 – 2018-10-07 (×9): 1000 mL
  Filled 2018-10-01 (×4): qty 1000

## 2018-10-01 MED ORDER — ALBUMIN HUMAN 5 % IV SOLN
12.5000 g | Freq: Once | INTRAVENOUS | Status: AC
  Administered 2018-10-01: 10:00:00 12.5 g via INTRAVENOUS
  Filled 2018-10-01: qty 250

## 2018-10-01 MED ORDER — SODIUM CHLORIDE 0.9% FLUSH
10.0000 mL | Freq: Two times a day (BID) | INTRAVENOUS | Status: DC
  Administered 2018-10-01: 16:00:00 20 mL
  Administered 2018-10-01 – 2018-10-02 (×2): 10 mL
  Administered 2018-10-02 – 2018-10-03 (×2): 20 mL
  Administered 2018-10-04 – 2018-10-06 (×5): 10 mL
  Administered 2018-10-07: 30 mL
  Administered 2018-10-07 – 2018-10-13 (×12): 10 mL
  Administered 2018-10-15: 20 mL
  Administered 2018-10-15: 10 mL
  Administered 2018-10-16: 20 mL
  Administered 2018-10-17 – 2018-10-23 (×11): 10 mL
  Administered 2018-10-24: 30 mL
  Administered 2018-10-24 – 2018-10-25 (×2): 10 mL
  Administered 2018-10-25: 10:00:00 20 mL
  Administered 2018-10-26 – 2018-11-01 (×12): 10 mL

## 2018-10-01 MED ORDER — CLONAZEPAM 0.5 MG PO TABS
0.5000 mg | ORAL_TABLET | Freq: Two times a day (BID) | ORAL | Status: DC
  Administered 2018-10-01 – 2018-10-07 (×14): 0.5 mg
  Filled 2018-10-01 (×14): qty 1
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
## 2018-10-01 NOTE — Progress Notes (Signed)
Peripherally Inserted Central Catheter/Midline Placement  The IV Nurse has discussed with the patient and/or persons authorized to consent for the patient, the purpose of this procedure and the potential benefits and risks involved with this procedure.  The benefits include less needle sticks, lab draws from the catheter, and the patient may be discharged home with the catheter. Risks include, but not limited to, infection, bleeding, blood clot (thrombus formation), and puncture of an artery; nerve damage and irregular heartbeat and possibility to perform a PICC exchange if needed/ordered by physician.  Alternatives to this procedure were also discussed.  Bard Power PICC patient education guide, fact sheet on infection prevention and patient information card has been provided to patient /or left at bedside.    PICC/Midline Placement Documentation  PICC Double Lumen 00/86/76 PICC Right Basilic 39 cm 0 cm (Active)  Indication for Insertion or Continuance of Line Vasoactive infusions 10/01/18 1210  Exposed Catheter (cm) 0 cm 10/01/18 1210  Site Assessment Clean;Dry;Intact 10/01/18 1210  Lumen #1 Status Flushed;Blood return noted 10/01/18 1210  Lumen #2 Status Flushed;Blood return noted 10/01/18 1210  Dressing Type Transparent 10/01/18 1210  Dressing Status Clean;Dry;Intact;Antimicrobial disc in place;Other (Comment) 10/01/18 1210  Line Adjustment (NICU/IV Team Only) Yes 10/01/18 1210  Dressing Intervention New dressing 10/01/18 1210  Dressing Change Due 10/08/18 10/01/18 1210   Mot[place] signed consent at bedside    Tony Stephenson 10/01/2018, 12:12 PM

## 2018-10-01 DEATH — deceased

## 2018-10-02 ENCOUNTER — Inpatient Hospital Stay (HOSPITAL_COMMUNITY): Payer: No Typology Code available for payment source

## 2018-10-02 LAB — CBC
HCT: 27.2 % — ABNORMAL LOW (ref 39.0–52.0)
Hemoglobin: 8.7 g/dL — ABNORMAL LOW (ref 13.0–17.0)
MCH: 30.5 pg (ref 26.0–34.0)
MCHC: 32 g/dL (ref 30.0–36.0)
MCV: 95.4 fL (ref 80.0–100.0)
Platelets: 132 10*3/uL — ABNORMAL LOW (ref 150–400)
RBC: 2.85 MIL/uL — ABNORMAL LOW (ref 4.22–5.81)
RDW: 13.8 % (ref 11.5–15.5)
WBC: 12.8 10*3/uL — ABNORMAL HIGH (ref 4.0–10.5)
nRBC: 0 % (ref 0.0–0.2)

## 2018-10-02 LAB — GLUCOSE, CAPILLARY
Glucose-Capillary: 107 mg/dL — ABNORMAL HIGH (ref 70–99)
Glucose-Capillary: 112 mg/dL — ABNORMAL HIGH (ref 70–99)
Glucose-Capillary: 115 mg/dL — ABNORMAL HIGH (ref 70–99)
Glucose-Capillary: 117 mg/dL — ABNORMAL HIGH (ref 70–99)
Glucose-Capillary: 124 mg/dL — ABNORMAL HIGH (ref 70–99)
Glucose-Capillary: 127 mg/dL — ABNORMAL HIGH (ref 70–99)

## 2018-10-02 LAB — BASIC METABOLIC PANEL
Anion gap: 5 (ref 5–15)
BUN: 16 mg/dL (ref 6–20)
CO2: 22 mmol/L (ref 22–32)
Calcium: 8.1 mg/dL — ABNORMAL LOW (ref 8.9–10.3)
Chloride: 115 mmol/L — ABNORMAL HIGH (ref 98–111)
Creatinine, Ser: 0.84 mg/dL (ref 0.61–1.24)
GFR calc Af Amer: >60 mL/min (ref >60)
GFR calc non Af Amer: >60 mL/min (ref >60)
Glucose, Bld: 131 mg/dL — ABNORMAL HIGH (ref 70–99)
Potassium: 3.5 mmol/L (ref 3.5–5.1)
Sodium: 142 mmol/L (ref 135–145)

## 2018-10-02 LAB — POCT I-STAT 7, (LYTES, BLD GAS, ICA,H+H)
Acid-base deficit: 2 mmol/L (ref 0.0–2.0)
Bicarbonate: 22.5 mmol/L (ref 20.0–28.0)
Calcium, Ion: 1.26 mmol/L (ref 1.15–1.40)
HCT: 30 % — ABNORMAL LOW (ref 39.0–52.0)
Hemoglobin: 10.2 g/dL — ABNORMAL LOW (ref 13.0–17.0)
O2 Saturation: 94 %
Patient temperature: 102.9
Potassium: 3.4 mmol/L — ABNORMAL LOW (ref 3.5–5.1)
Sodium: 145 mmol/L (ref 135–145)
TCO2: 24 mmol/L (ref 22–32)
pCO2 arterial: 41.1 mmHg (ref 32.0–48.0)
pH, Arterial: 7.356 (ref 7.350–7.450)
pO2, Arterial: 83 mmHg (ref 83.0–108.0)

## 2018-10-02 LAB — MAGNESIUM
Magnesium: 1.9 mg/dL (ref 1.7–2.4)
Magnesium: 2 mg/dL (ref 1.7–2.4)

## 2018-10-02 LAB — PHOSPHORUS
Phosphorus: 1.8 mg/dL — ABNORMAL LOW (ref 2.5–4.6)
Phosphorus: <1 mg/dL — CL (ref 2.5–4.6)

## 2018-10-02 LAB — TRIGLYCERIDES: Triglycerides: 100 mg/dL (ref <150)

## 2018-10-02 MED ORDER — POTASSIUM CHLORIDE 20 MEQ/15ML (10%) PO SOLN
20.0000 meq | Freq: Once | ORAL | Status: AC
  Administered 2018-10-02: 09:00:00 20 meq
  Filled 2018-10-02: qty 15

## 2018-10-02 MED ORDER — ATROPINE SULFATE 1 MG/10ML IJ SOSY
PREFILLED_SYRINGE | INTRAMUSCULAR | Status: AC
  Filled 2018-10-02: qty 10

## 2018-10-02 MED ORDER — POTASSIUM PHOSPHATES 15 MMOLE/5ML IV SOLN
30.0000 mmol | Freq: Once | INTRAVENOUS | Status: AC
  Administered 2018-10-02: 30 mmol via INTRAVENOUS
  Filled 2018-10-02: qty 10

## 2018-10-02 MED ORDER — ALBUMIN HUMAN 5 % IV SOLN
12.5000 g | Freq: Once | INTRAVENOUS | Status: AC
  Administered 2018-10-02: 10:00:00 12.5 g via INTRAVENOUS
  Filled 2018-10-02: qty 250

## 2018-10-02 MED ORDER — ENOXAPARIN SODIUM 40 MG/0.4ML ~~LOC~~ SOLN
40.0000 mg | SUBCUTANEOUS | Status: DC
  Administered 2018-10-02 – 2018-10-18 (×17): 40 mg via SUBCUTANEOUS
  Filled 2018-10-02 (×17): qty 0.4

## 2018-10-02 MED ORDER — ACETAMINOPHEN 160 MG/5ML PO SOLN
650.0000 mg | Freq: Four times a day (QID) | ORAL | Status: DC | PRN
  Administered 2018-10-02 – 2018-10-31 (×41): 650 mg
  Filled 2018-10-02 (×42): qty 20.3

## 2018-10-02 MED ORDER — QUETIAPINE FUMARATE 100 MG PO TABS
100.0000 mg | ORAL_TABLET | Freq: Two times a day (BID) | ORAL | Status: DC
  Administered 2018-10-02 – 2018-11-01 (×58): 100 mg
  Filled 2018-10-02 (×59): qty 1
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
## 2018-10-03 ENCOUNTER — Inpatient Hospital Stay (HOSPITAL_COMMUNITY): Payer: No Typology Code available for payment source

## 2018-10-03 ENCOUNTER — Inpatient Hospital Stay (HOSPITAL_COMMUNITY): Payer: No Typology Code available for payment source | Admitting: Certified Registered Nurse Anesthetist

## 2018-10-03 LAB — POCT I-STAT 7, (LYTES, BLD GAS, ICA,H+H)
Acid-base deficit: 1 mmol/L (ref 0.0–2.0)
Bicarbonate: 23.7 mmol/L (ref 20.0–28.0)
Calcium, Ion: 1.23 mmol/L (ref 1.15–1.40)
HCT: 25 % — ABNORMAL LOW (ref 39.0–52.0)
Hemoglobin: 8.5 g/dL — ABNORMAL LOW (ref 13.0–17.0)
O2 Saturation: 90 %
Patient temperature: 98.7
Potassium: 3.1 mmol/L — ABNORMAL LOW (ref 3.5–5.1)
Sodium: 146 mmol/L — ABNORMAL HIGH (ref 135–145)
TCO2: 25 mmol/L (ref 22–32)
pCO2 arterial: 39.8 mmHg (ref 32.0–48.0)
pH, Arterial: 7.383 (ref 7.350–7.450)
pO2, Arterial: 61 mmHg — ABNORMAL LOW (ref 83.0–108.0)

## 2018-10-03 LAB — BASIC METABOLIC PANEL
Anion gap: 8 (ref 5–15)
BUN: 18 mg/dL (ref 6–20)
CO2: 24 mmol/L (ref 22–32)
Calcium: 8.1 mg/dL — ABNORMAL LOW (ref 8.9–10.3)
Chloride: 110 mmol/L (ref 98–111)
Creatinine, Ser: 0.68 mg/dL (ref 0.61–1.24)
GFR calc Af Amer: >60 mL/min (ref >60)
GFR calc non Af Amer: >60 mL/min (ref >60)
Glucose, Bld: 136 mg/dL — ABNORMAL HIGH (ref 70–99)
Potassium: 3.4 mmol/L — ABNORMAL LOW (ref 3.5–5.1)
Sodium: 142 mmol/L (ref 135–145)

## 2018-10-03 LAB — GLUCOSE, CAPILLARY
Glucose-Capillary: 113 mg/dL — ABNORMAL HIGH (ref 70–99)
Glucose-Capillary: 122 mg/dL — ABNORMAL HIGH (ref 70–99)
Glucose-Capillary: 115 mg/dL — ABNORMAL HIGH (ref 70–99)
Glucose-Capillary: 117 mg/dL — ABNORMAL HIGH (ref 70–99)
Glucose-Capillary: 117 mg/dL — ABNORMAL HIGH (ref 70–99)
Glucose-Capillary: 140 mg/dL — ABNORMAL HIGH (ref 70–99)

## 2018-10-03 LAB — CBC
HCT: 25.4 % — ABNORMAL LOW (ref 39.0–52.0)
Hemoglobin: 8.3 g/dL — ABNORMAL LOW (ref 13.0–17.0)
MCH: 30.7 pg (ref 26.0–34.0)
MCHC: 32.7 g/dL (ref 30.0–36.0)
MCV: 94.1 fL (ref 80.0–100.0)
Platelets: 126 10*3/uL — ABNORMAL LOW (ref 150–400)
RBC: 2.7 MIL/uL — ABNORMAL LOW (ref 4.22–5.81)
RDW: 13.9 % (ref 11.5–15.5)
WBC: 11.3 10*3/uL — ABNORMAL HIGH (ref 4.0–10.5)
nRBC: 0 % (ref 0.0–0.2)

## 2018-10-03 LAB — PHOSPHORUS
Phosphorus: 2.8 mg/dL (ref 2.5–4.6)
Phosphorus: 3.3 mg/dL (ref 2.5–4.6)

## 2018-10-03 LAB — TRIGLYCERIDES: Triglycerides: 80 mg/dL (ref <150)

## 2018-10-03 NOTE — Progress Notes (Signed)
Patient ID: Tony Stephenson, male   DOB: [birth year], 38 y.o.   MRN: [id number] Patient awake follows commands with his eyes and face complete loss of sensation motor below C4.  No new neurosurgical recommendations continue ventilatory support
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
## 2018-10-03 NOTE — Progress Notes (Addendum)
Called to bedside because the patient underwent a code after "biting through the tube and losing the cuff". Patient underwent extubation and reintubation as well as CPR for PEA. XR reviewed, tube in good position. Normal rhythm and pressure currently. -keep sedated -mom updated of the event

## 2018-10-03 NOTE — Anesthesia Procedure Notes (Signed)
Procedure Name: Intubation Date/Time: 10/03/2018 7:55 PM Performed by: Oletta Lamas, CRNA Pre-anesthesia Checklist: Emergency Drugs available, Suction available, Patient being monitored and Patient identified Patient Re-evaluated:Patient Re-evaluated prior to induction Oxygen Delivery Method: Ambu bag Preoxygenation: Pre-oxygenation with 100% oxygen Ventilation: Mask ventilation without difficulty Laryngoscope Size: Glidescope and 3 Tube type: Oral Tube size: 8.0 mm Number of attempts: 1 Airway Equipment and Method: Stylet Placement Confirmation: ETT inserted through vocal cords under direct vision,  breath sounds checked- equal and bilateral and CO2 detector Secured at: 23 cm Tube secured with: Tape Dental Injury: Teeth and Oropharynx as per pre-operative assessment

## 2018-10-04 LAB — TYPE AND SCREEN
ABO/RH(D): O POS
Antibody Screen: NEGATIVE
Unit division: 0
Unit division: 0

## 2018-10-04 LAB — BPAM RBC
Blood Product Expiration Date: 202010052359
Blood Product Expiration Date: 202010052359
ISSUE DATE / TIME: 202008310740
ISSUE DATE / TIME: 202008310740
Unit Type and Rh: 5100
Unit Type and Rh: 5100

## 2018-10-04 LAB — TRIGLYCERIDES: Triglycerides: 126 mg/dL (ref <150)

## 2018-10-04 LAB — GLUCOSE, CAPILLARY
Glucose-Capillary: 123 mg/dL — ABNORMAL HIGH (ref 70–99)
Glucose-Capillary: 126 mg/dL — ABNORMAL HIGH (ref 70–99)
Glucose-Capillary: 108 mg/dL — ABNORMAL HIGH (ref 70–99)
Glucose-Capillary: 118 mg/dL — ABNORMAL HIGH (ref 70–99)
Glucose-Capillary: 119 mg/dL — ABNORMAL HIGH (ref 70–99)
Glucose-Capillary: 94 mg/dL (ref 70–99)

## 2018-10-04 MED FILL — Medication: Qty: 1 | Status: AC

## 2018-10-04 NOTE — Code Documentation (Signed)
  Patient Name: Tony Stephenson   MRN: [id number]   Date of Birth/ Sex: 01-May-1980 , male      Admission Date: 10/09/18  Attending Provider: Md, Trauma, MD  Primary Diagnosis: <principal problem not specified>   Indication: Pt was in his usual state of health until this 1915 PM, when he was noted to be in PEA. Code blue was subsequently called. At the time of arrival on scene, ACLS protocol was underway and ROSC had been obtained. Patient subsequently had bradycardia with asystole and ACLS was resumed for two minutes with successful ROSC.   Technical Description:  - CPR performance duration:  11 minutes  - Was defibrillation or cardioversion used? No   - Was external pacer placed? No  - Was patient intubated pre/post CPR? Yes   Medications Administered: Y = Yes; Blank = No Amiodarone    Atropine    Calcium    Epinephrine  x  Lidocaine    Magnesium    Norepinephrine    Phenylephrine    Sodium bicarbonate    Vasopressin     Post CPR evaluation:  - Final Status - Was patient successfully resuscitated ? Yes - What is current rhythm? NSR - What is current hemodynamic status? Hemodynamically stable   Miscellaneous Information:  - Labs sent, including: ABG  - Primary team notified?  Yes  - Family Notified? Per primary  - Additional notes/ transfer status: Already in ICU     Brieana Shimmin, [place], DO  10/04/2018, 12:07 AM

## 2018-10-04 NOTE — Progress Notes (Signed)
Pt biting down on ETT, causing desaturations to the 80s. Mouthing the words "hurt" and "I can't breathe". Ordered RASS goal is -3, however pt is a 0 to 1. Sedation increased to prevent further biting, as patient has already had ETT replaced x2 for biting. RASS currently -2, awakens easily to voice, communicating appropriately. Endorses improvement in pain and air hunger.
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
## 2018-10-04 NOTE — Progress Notes (Signed)
Desaturations to 88% noted x3 in the 0600 hour, gradually increasing to high 90s after 2 minutes of 100% FiO2. RT notified, who instructed FiO2 be increased from 40% to 60%.

## 2018-10-04 NOTE — Progress Notes (Signed)
Response to Code Blue. Pt stabilized.   Chaplain released by Nurse.  Reported to 4N Chaplain for follow-up.

## 2018-10-04 NOTE — Progress Notes (Addendum)
1946- Patient's vent was alarming as if it had popped off the ETT. Upon quickly assessing patient and realizing all tubes were connected, a loud wooshing sound was coming from the patient's mouth. RN called for help over the radio at this time because it sounded like the ETT cuff had blown. The patient looked distressed at this time and on heart monitor, patient's heart rate went brady in the 30s and then asystole. No pulse was felt at this time so a code blue was activated and CPR was initiated. RT came into room when CPR was started, she tested the cuff which would not inflate so then she removed the ETT because she was unsure if it was still in airway. RT started bagging the patient right after tube was removed. Patient shaking head from side to side when RT attempted to look with glidescope for airway placement. RT unable to place tube.  ROSC achieved and new airway obtained by CRNA. Patient is resting comfortably on sedation.

## 2018-10-05 ENCOUNTER — Inpatient Hospital Stay (HOSPITAL_COMMUNITY): Payer: No Typology Code available for payment source

## 2018-10-05 LAB — GLUCOSE, CAPILLARY
Glucose-Capillary: 115 mg/dL — ABNORMAL HIGH (ref 70–99)
Glucose-Capillary: 139 mg/dL — ABNORMAL HIGH (ref 70–99)
Glucose-Capillary: 128 mg/dL — ABNORMAL HIGH (ref 70–99)
Glucose-Capillary: 159 mg/dL — ABNORMAL HIGH (ref 70–99)
Glucose-Capillary: 137 mg/dL — ABNORMAL HIGH (ref 70–99)
Glucose-Capillary: 129 mg/dL — ABNORMAL HIGH (ref 70–99)

## 2018-10-05 LAB — TRIGLYCERIDES
Triglycerides: 457 mg/dL — ABNORMAL HIGH (ref <150)
Triglycerides: 48 mg/dL (ref <150)

## 2018-10-05 MED ORDER — SODIUM CHLORIDE 0.9 % IV SOLN
2.0000 g | INTRAVENOUS | Status: DC
  Administered 2018-10-05 – 2018-10-10 (×6): 2 g via INTRAVENOUS
  Filled 2018-10-05 (×2): qty 2
  Filled 2018-10-05 (×2): qty 20
  Filled 2018-10-05 (×3): qty 2

## 2018-10-05 MED ORDER — ALBUTEROL SULFATE (2.5 MG/3ML) 0.083% IN NEBU
2.5000 mg | INHALATION_SOLUTION | Freq: Three times a day (TID) | RESPIRATORY_TRACT | Status: DC
  Administered 2018-10-05 – 2018-10-21 (×49): 2.5 mg via RESPIRATORY_TRACT
  Filled 2018-10-05 (×49): qty 3

## 2018-10-05 MED ORDER — ACETYLCYSTEINE 20 % IN SOLN
4.0000 mL | Freq: Three times a day (TID) | RESPIRATORY_TRACT | Status: DC
  Administered 2018-10-05 – 2018-10-21 (×47): 4 mL via RESPIRATORY_TRACT
  Filled 2018-10-05 (×47): qty 4

## 2018-10-05 MED ORDER — IBUPROFEN 100 MG/5ML PO SUSP
600.0000 mg | Freq: Once | ORAL | Status: AC
  Administered 2018-10-05: 07:00:00 600 mg via ORAL
  Filled 2018-10-05: qty 30
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
## 2018-10-05 NOTE — Progress Notes (Signed)
Frequent desaturations and mucus plugging observed; lavaged by RT x2 with transient improvement in sats. CXR ordered, Dr. Rosendo Gros notified. New orders received

## 2018-10-05 NOTE — Progress Notes (Signed)
Temperature 39.8, refractory to Tylenol and ice packs. Dr. Rosendo Gros notified, verbal order received for ibuprofen

## 2018-10-05 NOTE — Progress Notes (Signed)
Patient ID: Tony Stephenson, male   DOB: [birth year], 38 y.o.   MRN: [id number] No significant change remains C4 quad  Supportive care and pulmonary management

## 2018-10-05 NOTE — Plan of Care (Signed)
  Problem: Education: Goal: Knowledge of disease or condition will improve Outcome: Progressing   

## 2018-10-05 NOTE — Progress Notes (Signed)
RT called to room for pt desturating into the 70's with a good pleth. Pt auscultated and suctioned w/minimal improvement in sats. RT and RN took pt off of vent and bag lavaged pt and were able to obtain copious amounts of thick secretions with multiple plugs. Pts sats now 98%. RT placed pt back on vent on current settings. Pt respiratory status is stable at this time. RT will continue to monitor.

## 2018-10-06 LAB — GLUCOSE, CAPILLARY
Glucose-Capillary: 124 mg/dL — ABNORMAL HIGH (ref 70–99)
Glucose-Capillary: 162 mg/dL — ABNORMAL HIGH (ref 70–99)
Glucose-Capillary: 121 mg/dL — ABNORMAL HIGH (ref 70–99)
Glucose-Capillary: 118 mg/dL — ABNORMAL HIGH (ref 70–99)
Glucose-Capillary: 120 mg/dL — ABNORMAL HIGH (ref 70–99)

## 2018-10-06 LAB — TRIGLYCERIDES
Triglycerides: 495 mg/dL — ABNORMAL HIGH (ref <150)
Triglycerides: 62 mg/dL (ref <150)

## 2018-10-06 MED ORDER — VANCOMYCIN HCL 10 G IV SOLR
1750.0000 mg | Freq: Three times a day (TID) | INTRAVENOUS | Status: DC
  Administered 2018-10-06 – 2018-10-07 (×5): 1750 mg via INTRAVENOUS
  Filled 2018-10-06 (×7): qty 1750

## 2018-10-06 NOTE — Progress Notes (Signed)
Pharmacy Antibiotic Note  Tony Stephenson is a 38 y.o. male admitted on 09/03/2018 with pneumonia.  Pharmacy has been consulted for Vancomycin dosing.  Vancomycin 1750 mg IV Q 8 hrs. Goal AUC 400-550. Expected AUC: 526 SCr used: 0.68   Plan: Initiate Vancomycin 1750 mg IV q8hr Monitor renal function, clinical status, C&S and vanc levels as needed.  Height: 6' (182.9 cm) Weight: 222 lb 7.1 oz (100.9 kg) IBW/kg (Calculated) : 77.6  Temp (24hrs), Avg:100.8 F (38.2 C), Min:99.3 F (37.4 C), Max:102.6 F (39.2 C)  Recent Labs  Lab 09/24/2018 0758 09/21/2018 0802 10/01/18 0323 10/01/18 0954 10/02/18 0440 10/03/18 0533  WBC  --  13.6* 11.5*  --  12.8* 11.3*  CREATININE 1.10 1.23 0.79  --  0.84 0.68  LATICACIDVEN  --  4.8*  --  1.8  --   --     Estimated Creatinine Clearance: 152.4 mL/min (by C-G formula based on SCr of 0.68 mg/dL).    No Known Allergies  Antimicrobials this admission: Rocephin 9/5 >>  Vanc 9/6 >>   Microbiology results:  9/5 Sputum: GPCs  8/31 MRSA PCR: negative  Thank you for allowing pharmacy to be a part of this patient's care.  [name], PharmD, [facility] Clinical Pharmacist Please see AMION for all Pharmacists' Contact Phone Numbers 10/06/2018, 8:29 AM

## 2018-10-06 NOTE — Progress Notes (Signed)
Pt's triglycerides 495. Propofol turned off. Will report to AM nurse for day shift Trauma MD team clarification.
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
## 2018-10-06 NOTE — Progress Notes (Signed)
Subjective: The patient is alert.  He follows commands with his cranial nerves.  Objective: Vital signs in last 24 hours: Temp:  [37.4 C-39.2 C] 37.6 C (09/06 0600) Pulse Rate:  [73-111] 82 (09/06 0600) Resp:  [19-22] 22 (09/06 0600) BP: (102-121)/(47-63) 115/63 (09/06 0600) SpO2:  [91 %-97 %] 96 % (09/06 0600) FiO2 (%):  [50 %-60 %] 50 % (09/06 0754) Weight:  [100.9 kg] 100.9 kg (09/06 0500) Estimated body mass index is 30.17 kg/m as calculated from the following:   Height as of this encounter: 6' (1.829 m).   Weight as of this encounter: 100.9 kg.   Intake/Output from previous day: 09/05 0701 - 09/06 0700 In: 9563.8 [I.V.:2296.2; NG/GT:1265; IV Piggyback:100] Out: 7564 [Urine:1550] Intake/Output this shift: No intake/output data recorded.  Physical exam the patient is alert, following commands with his cranial nerves.  He is quadriplegic.  Lab Results: Recent Labs    10/03/18 2033  HGB 8.5*  HCT 25.0*   BMET Recent Labs    10/03/18 2033  NA 146*  K 3.1*    Studies/Results: Dg Chest Port 1 View  Result Date: 10/05/2018 CLINICAL DATA:  Oxygen desaturation EXAM: PORTABLE CHEST 1 VIEW COMPARISON:  October 03, 2018 FINDINGS: ET tube with is 4 cm above the level of the carina. NG tube is seen coursing below the diaphragm. A right-sided PICC is seen with the tip at the superior cavoatrial junction. Hazy airspace opacities are seen within both lung bases, with slight interval improvement from the prior exam. IMPRESSION: Support lines and tubes in satisfactory position. Slight interval improvement in the hazy airspace opacity in both lung bases. Electronically Signed   By: Prudencio Pair M.D.   On: 10/05/2018 01:24    Assessment/Plan: Quadriplegia: No changes or new recommendations.  He will need nursing home placement.  LOS: 6 days     Ophelia Charter 10/06/2018, 8:43 AM

## 2018-10-07 LAB — CULTURE, RESPIRATORY W GRAM STAIN

## 2018-10-07 LAB — GLUCOSE, CAPILLARY
Glucose-Capillary: 107 mg/dL — ABNORMAL HIGH (ref 70–99)
Glucose-Capillary: 108 mg/dL — ABNORMAL HIGH (ref 70–99)
Glucose-Capillary: 111 mg/dL — ABNORMAL HIGH (ref 70–99)
Glucose-Capillary: 115 mg/dL — ABNORMAL HIGH (ref 70–99)
Glucose-Capillary: 122 mg/dL — ABNORMAL HIGH (ref 70–99)
Glucose-Capillary: 126 mg/dL — ABNORMAL HIGH (ref 70–99)
Glucose-Capillary: 135 mg/dL — ABNORMAL HIGH (ref 70–99)

## 2018-10-07 LAB — BASIC METABOLIC PANEL
Anion gap: 8 (ref 5–15)
BUN: 25 mg/dL — ABNORMAL HIGH (ref 6–20)
CO2: 27 mmol/L (ref 22–32)
Calcium: 8.1 mg/dL — ABNORMAL LOW (ref 8.9–10.3)
Chloride: 114 mmol/L — ABNORMAL HIGH (ref 98–111)
Creatinine, Ser: 0.87 mg/dL (ref 0.61–1.24)
GFR calc Af Amer: >60 mL/min (ref >60)
GFR calc non Af Amer: >60 mL/min (ref >60)
Glucose, Bld: 132 mg/dL — ABNORMAL HIGH (ref 70–99)
Potassium: 4 mmol/L (ref 3.5–5.1)
Sodium: 149 mmol/L — ABNORMAL HIGH (ref 135–145)

## 2018-10-07 LAB — CBC
HCT: 22.3 % — ABNORMAL LOW (ref 39.0–52.0)
Hemoglobin: 6.9 g/dL — CL (ref 13.0–17.0)
MCH: 30.8 pg (ref 26.0–34.0)
MCHC: 30.9 g/dL (ref 30.0–36.0)
MCV: 99.6 fL (ref 80.0–100.0)
Platelets: 210 K/uL (ref 150–400)
RBC: 2.24 MIL/uL — ABNORMAL LOW (ref 4.22–5.81)
RDW: 14.6 % (ref 11.5–15.5)
WBC: 13 K/uL — ABNORMAL HIGH (ref 4.0–10.5)
nRBC: 0.3 % — ABNORMAL HIGH (ref 0.0–0.2)

## 2018-10-07 LAB — TRIGLYCERIDES: Triglycerides: 100 mg/dL

## 2018-10-07 LAB — PREPARE RBC (CROSSMATCH)

## 2018-10-07 MED ORDER — SODIUM CHLORIDE 0.9% IV SOLUTION
Freq: Once | INTRAVENOUS | Status: AC
  Administered 2018-10-07: 14:00:00 via INTRAVENOUS

## 2018-10-07 NOTE — Progress Notes (Signed)
Pt is to receive 1 unit of PRBC's and is currently ready to transfuse in Blood Bank. Consent for Blood has not been obtained by Pt's next of Kin due to family member wanting to speak to MD regarding understanding of Pt's need for blood at this time and per family member some other questions. Discussed with family member Blood ready and will transfuse as soon as consent signed, family member stated if not emergent will wait for ordering MD to call.    Pt is stable at this time. MD updated and stated ok with holding transfusion until able to talk to family member. Nursing will continue to monitor.

## 2018-10-07 NOTE — Progress Notes (Signed)
Subjective: The patient is alert and agitated.  Objective: Vital signs in last 24 hours: Temp:  [37.3 C-38.6 C] 38 C (09/07 0700) Pulse Rate:  [53-91] 73 (09/07 0700) Resp:  [22] 22 (09/07 0700) BP: (113-130)/(55-83) 121/63 (09/07 0700) SpO2:  [88 %-95 %] 95 % (09/07 0800) FiO2 (%):  [50 %] 50 % (09/07 0800) Weight:  [98.2 kg] 98.2 kg (09/07 0500) Estimated body mass index is 29.36 kg/m as calculated from the following:   Height as of this encounter: 6' (1.829 m).   Weight as of this encounter: 98.2 kg.   Intake/Output from previous day: 09/06 0701 - 09/07 0700 In: 4786.6 [I.V.:2196.7; NG/GT:990; IV Piggyback:1600] Out: 2035 [Urine:3225] Intake/Output this shift: No intake/output data recorded.  Physical exam the patient is alert and follows commands with his cranial nerves.  He is quadriplegic.  Lab Results: Recent Labs    10/07/18 0512  WBC 13.0*  HGB 6.9*  HCT 22.3*  PLT 210   BMET Recent Labs    10/07/18 0512  NA 149*  K 4.0  CL 114*  CO2 27  GLUCOSE 132*  BUN 25*  CREATININE 0.87  CALCIUM 8.1*    Studies/Results: No results found.  Assessment/Plan: Quadriplegia: No change.  LOS: 7 days     Ophelia Charter 10/07/2018, 8:42 AM
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
## 2018-10-08 ENCOUNTER — Inpatient Hospital Stay (HOSPITAL_COMMUNITY): Payer: No Typology Code available for payment source

## 2018-10-08 LAB — TYPE AND SCREEN
ABO/RH(D): O POS
Antibody Screen: NEGATIVE
Unit division: 0

## 2018-10-08 LAB — CBC
HCT: 26.8 % — ABNORMAL LOW (ref 39.0–52.0)
HCT: 28.4 % — ABNORMAL LOW (ref 39.0–52.0)
Hemoglobin: 8.4 g/dL — ABNORMAL LOW (ref 13.0–17.0)
Hemoglobin: 9.1 g/dL — ABNORMAL LOW (ref 13.0–17.0)
MCH: 30.1 pg (ref 26.0–34.0)
MCH: 30.4 pg (ref 26.0–34.0)
MCHC: 31.3 g/dL (ref 30.0–36.0)
MCHC: 32 g/dL (ref 30.0–36.0)
MCV: 96.1 fL (ref 80.0–100.0)
MCV: 95 fL (ref 80.0–100.0)
Platelets: 240 10*3/uL (ref 150–400)
Platelets: 249 10*3/uL (ref 150–400)
RBC: 2.79 MIL/uL — ABNORMAL LOW (ref 4.22–5.81)
RBC: 2.99 MIL/uL — ABNORMAL LOW (ref 4.22–5.81)
RDW: 14.9 % (ref 11.5–15.5)
RDW: 14.6 % (ref 11.5–15.5)
WBC: 12.7 10*3/uL — ABNORMAL HIGH (ref 4.0–10.5)
WBC: 15 10*3/uL — ABNORMAL HIGH (ref 4.0–10.5)
nRBC: 0.3 % — ABNORMAL HIGH (ref 0.0–0.2)
nRBC: 0.6 % — ABNORMAL HIGH (ref 0.0–0.2)

## 2018-10-08 LAB — BASIC METABOLIC PANEL
Anion gap: 11 (ref 5–15)
BUN: 23 mg/dL — ABNORMAL HIGH (ref 6–20)
CO2: 28 mmol/L (ref 22–32)
Calcium: 8.4 mg/dL — ABNORMAL LOW (ref 8.9–10.3)
Chloride: 108 mmol/L (ref 98–111)
Creatinine, Ser: 0.78 mg/dL (ref 0.61–1.24)
GFR calc Af Amer: >60 mL/min (ref >60)
GFR calc non Af Amer: >60 mL/min (ref >60)
Glucose, Bld: 128 mg/dL — ABNORMAL HIGH (ref 70–99)
Potassium: 4 mmol/L (ref 3.5–5.1)
Sodium: 147 mmol/L — ABNORMAL HIGH (ref 135–145)

## 2018-10-08 LAB — GLUCOSE, CAPILLARY
Glucose-Capillary: 114 mg/dL — ABNORMAL HIGH (ref 70–99)
Glucose-Capillary: 123 mg/dL — ABNORMAL HIGH (ref 70–99)
Glucose-Capillary: 150 mg/dL — ABNORMAL HIGH (ref 70–99)
Glucose-Capillary: 123 mg/dL — ABNORMAL HIGH (ref 70–99)
Glucose-Capillary: 130 mg/dL — ABNORMAL HIGH (ref 70–99)
Glucose-Capillary: 109 mg/dL — ABNORMAL HIGH (ref 70–99)

## 2018-10-08 LAB — BPAM RBC
Blood Product Expiration Date: 202010112359
ISSUE DATE / TIME: 202009071323
Unit Type and Rh: 5100

## 2018-10-08 LAB — MAGNESIUM: Magnesium: 2.1 mg/dL (ref 1.7–2.4)

## 2018-10-08 LAB — TRIGLYCERIDES
Triglycerides: 535 mg/dL — ABNORMAL HIGH (ref <150)
Triglycerides: 90 mg/dL (ref <150)

## 2018-10-08 MED ORDER — PIVOT 1.5 CAL PO LIQD: 1000.0000 mL | ORAL | Status: DC

## 2018-10-08 MED ORDER — PROPOFOL 1000 MG/100ML IV EMUL
INTRAVENOUS | Status: AC
  Filled 2018-10-08: qty 100

## 2018-10-08 MED ORDER — FUROSEMIDE 10 MG/ML IJ SOLN
20.0000 mg | Freq: Once | INTRAMUSCULAR | Status: AC
  Administered 2018-10-08: 20 mg via INTRAVENOUS
  Filled 2018-10-08: qty 2

## 2018-10-08 MED ORDER — PROPOFOL 1000 MG/100ML IV EMUL
5.0000 ug/kg/min | INTRAVENOUS | Status: AC
  Administered 2018-10-08: 40 ug/kg/min via INTRAVENOUS
  Administered 2018-10-08: 23:00:00 50 ug/kg/min via INTRAVENOUS
  Administered 2018-10-09 (×2): 40 ug/kg/min via INTRAVENOUS
  Administered 2018-10-09 (×3): 50 ug/kg/min via INTRAVENOUS
  Administered 2018-10-10: 60 ug/kg/min via INTRAVENOUS
  Administered 2018-10-10 (×2): 50 ug/kg/min via INTRAVENOUS
  Administered 2018-10-10 (×2): 60 ug/kg/min via INTRAVENOUS
  Administered 2018-10-10 – 2018-10-11 (×2): 50 ug/kg/min via INTRAVENOUS
  Administered 2018-10-11 (×6): 60 ug/kg/min via INTRAVENOUS
  Administered 2018-10-11 – 2018-10-12 (×2): 50 ug/kg/min via INTRAVENOUS
  Administered 2018-10-12 (×4): 70 ug/kg/min via INTRAVENOUS
  Administered 2018-10-12: 55 ug/kg/min via INTRAVENOUS
  Administered 2018-10-12 – 2018-10-13 (×4): 70 ug/kg/min via INTRAVENOUS
  Administered 2018-10-13 (×2): 40 ug/kg/min via INTRAVENOUS
  Administered 2018-10-13: 04:00:00 70 ug/kg/min via INTRAVENOUS
  Administered 2018-10-13 – 2018-10-15 (×10): 50 ug/kg/min via INTRAVENOUS
  Administered 2018-10-15: 40 ug/kg/min via INTRAVENOUS
  Administered 2018-10-15: 16:00:00 30 ug/kg/min via INTRAVENOUS
  Administered 2018-10-15 (×2): 50 ug/kg/min via INTRAVENOUS
  Administered 2018-10-16 – 2018-10-17 (×9): 60 ug/kg/min via INTRAVENOUS
  Filled 2018-10-08 (×21): qty 100
  Filled 2018-10-08: qty 200
  Filled 2018-10-08 (×5): qty 100
  Filled 2018-10-08: qty 200
  Filled 2018-10-08 (×22): qty 100
  Filled 2018-10-08: qty 200
  Filled 2018-10-08 (×3): qty 100

## 2018-10-08 MED ORDER — CLONAZEPAM 1 MG PO TABS
1.0000 mg | ORAL_TABLET | Freq: Two times a day (BID) | ORAL | Status: DC
  Administered 2018-10-08 – 2018-11-01 (×46): 1 mg
  Filled 2018-10-08 (×46): qty 1

## 2018-10-08 MED ORDER — PIVOT 1.5 CAL PO LIQD
1000.0000 mL | ORAL | Status: DC
  Administered 2018-10-08 – 2018-10-28 (×14): 1000 mL
  Filled 2018-10-08 (×12): qty 1000

## 2018-10-08 MED ORDER — ATROPINE SULFATE 1 MG/10ML IJ SOSY
1.0000 mg | PREFILLED_SYRINGE | Freq: Once | INTRAMUSCULAR | Status: AC
  Administered 2018-10-08: 1 mg via INTRAVENOUS

## 2018-10-08 MED ORDER — PANTOPRAZOLE SODIUM 40 MG PO PACK
40.0000 mg | PACK | Freq: Every day | ORAL | Status: DC
  Administered 2018-10-09 – 2018-11-01 (×21): 40 mg
  Filled 2018-10-08 (×21): qty 20

## 2018-10-08 MED ORDER — ATROPINE SULFATE 1 MG/10ML IJ SOSY
PREFILLED_SYRINGE | INTRAMUSCULAR | Status: AC
  Filled 2018-10-08: qty 10

## 2018-10-08 NOTE — Plan of Care (Signed)
Pt had 3 episodes during shift of dropping sats to low 80s.  Would return back to (3 or better after suctioning and having oxygen via vent at 100% for suctioning.  RT did increase FiO2 on vent to 60%.  Since this change, sats staying 95 to 97%.   Temp remains elevated slightly from 37.7 to 38.1 C (core temp).  Tylenol PRN as needed.  Also gave slightly cool bath that assisting in lowering temp some.    Monitor showed SR without ectopy.  BP did elevate some during shift to 150s SBP. Titrating fentanly gtt for pain.  Continue to receive tube feeding via OGT:  Pivot 1.5 at 61ml/hr.    Pt tolerating tubefeeding without incidient.  Problem: Bowel/Gastric: Goal: Ability to demonstrate the techniques of an individualized bowel program will improve Outcome: Progressing   Problem: Education: Goal: Knowledge of disease or condition will improve Outcome: Progressing Goal: Knowledge of the prescribed therapeutic regimen will improve Outcome: Progressing   Problem: Coping: Goal: Ability to identify and develop effective coping behavior will improve Outcome: Progressing Goal: Ability to verbalize feelings will improve Outcome: Progressing   Problem: Self-Care: Goal: Ability to participate in self-care as condition permits will improve Outcome: Progressing   Problem: Skin Integrity: Goal: Risk for impaired skin integrity will decrease Outcome: Progressing   Problem: Urinary Elimination: Goal: Ability to achieve a regular elimination pattern will improve Outcome: Progressing
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
## 2018-10-08 NOTE — Progress Notes (Addendum)
Patient ID: Tony Stephenson, male   DOB: 03/29/1980, 38 y.o.   MRN: [id number] Desaturation and bradycardia had episode of asystole, atropine given and after bagging sats improved. Has cxr that is ok will advance ng tube, labs pending Will restart propofol as appears he is agitated and this may be part of desaturation.  Discussed suctioning as may have had mucous plug also

## 2018-10-08 NOTE — Progress Notes (Signed)
RT called to bedside due to patient desating. Patient turned up to 100% on ventilator and patient oxygen saturations only in 70s on arrival. RT bagged patient  until oxygen saturation was in high 90s. Will continue to monitor.
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
## 2018-10-08 NOTE — Code Documentation (Addendum)
  Patient Name: Tony Stephenson   MRN: [id number]   Date of Birth/ Sex: Jun 20, 1980 , male      Admission Date: 28-Oct-2018  Attending Provider: Md, Trauma, MD  Primary Diagnosis: <principal problem not specified>   Indication: Pt was in his usual state of health until this 6:40 PM, when he was noted to be asystole. Code blue was subsequently called. At the time of arrival on scene, ACLS protocol was underway and ROSC was already achieved. Patient was AOx3   Technical Description:  - CPR performance duration:  30 seconds  - Was defibrillation or cardioversion used? No   - Was external pacer placed? No  - Was patient intubated pre/post CPR? Already intubated pre-CPR   Medications Administered: Y = Yes; Blank = No Amiodarone    Atropine  Y  Calcium    Epinephrine    Lidocaine    Magnesium    Norepinephrine    Phenylephrine    Sodium bicarbonate    Vasopressin     Post CPR evaluation:  - Final Status - Was patient successfully resuscitated ? Yes - What is current rhythm? Tachycardic sinus rhythm - What is current hemodynamic status? stable  Miscellaneous Information:  - Labs sent, including: CBC, BMP, CXR  - Primary team notified?  Yes  - Family Notified? No  - Additional notes/ transfer status: none     Gladys Damme, MD  10/08/2018, 6:54 PM

## 2018-10-08 NOTE — Progress Notes (Addendum)
pt desating in the 80s & HR 30s-40s, RT called, went asystole at 1837.  Compressions for 30 seconds, atropine given at 1838. ROSC at 1838.  Pt desating again 10 mins later for about 15 mins, RT bagged until oxygen saturation was in high 90s.  Will continue to monitor.

## 2018-10-09 LAB — BASIC METABOLIC PANEL
Anion gap: 8 (ref 5–15)
Anion gap: 8 (ref 5–15)
BUN: 24 mg/dL — ABNORMAL HIGH (ref 6–20)
BUN: 28 mg/dL — ABNORMAL HIGH (ref 6–20)
CO2: 26 mmol/L (ref 22–32)
CO2: 27 mmol/L (ref 22–32)
Calcium: 8 mg/dL — ABNORMAL LOW (ref 8.9–10.3)
Calcium: 8 mg/dL — ABNORMAL LOW (ref 8.9–10.3)
Chloride: 110 mmol/L (ref 98–111)
Chloride: 106 mmol/L (ref 98–111)
Creatinine, Ser: 0.88 mg/dL (ref 0.61–1.24)
Creatinine, Ser: 0.75 mg/dL (ref 0.61–1.24)
GFR calc Af Amer: >60 mL/min (ref >60)
GFR calc Af Amer: >60 mL/min (ref >60)
GFR calc non Af Amer: >60 mL/min (ref >60)
GFR calc non Af Amer: >60 mL/min (ref >60)
Glucose, Bld: 115 mg/dL — ABNORMAL HIGH (ref 70–99)
Glucose, Bld: 124 mg/dL — ABNORMAL HIGH (ref 70–99)
Potassium: 3.4 mmol/L — ABNORMAL LOW (ref 3.5–5.1)
Potassium: 4.3 mmol/L (ref 3.5–5.1)
Sodium: 144 mmol/L (ref 135–145)
Sodium: 141 mmol/L (ref 135–145)

## 2018-10-09 LAB — CBC
HCT: 25.1 % — ABNORMAL LOW (ref 39.0–52.0)
Hemoglobin: 7.6 g/dL — ABNORMAL LOW (ref 13.0–17.0)
MCH: 29.9 pg (ref 26.0–34.0)
MCHC: 30.3 g/dL (ref 30.0–36.0)
MCV: 98.8 fL (ref 80.0–100.0)
Platelets: 236 10*3/uL (ref 150–400)
RBC: 2.54 MIL/uL — ABNORMAL LOW (ref 4.22–5.81)
RDW: 14.7 % (ref 11.5–15.5)
WBC: 11.9 10*3/uL — ABNORMAL HIGH (ref 4.0–10.5)
nRBC: 0.3 % — ABNORMAL HIGH (ref 0.0–0.2)

## 2018-10-09 LAB — GLUCOSE, CAPILLARY
Glucose-Capillary: 106 mg/dL — ABNORMAL HIGH (ref 70–99)
Glucose-Capillary: 125 mg/dL — ABNORMAL HIGH (ref 70–99)
Glucose-Capillary: 121 mg/dL — ABNORMAL HIGH (ref 70–99)
Glucose-Capillary: 115 mg/dL — ABNORMAL HIGH (ref 70–99)
Glucose-Capillary: 123 mg/dL — ABNORMAL HIGH (ref 70–99)
Glucose-Capillary: 138 mg/dL — ABNORMAL HIGH (ref 70–99)

## 2018-10-09 LAB — TRIGLYCERIDES: Triglycerides: 116 mg/dL (ref <150)

## 2018-10-09 MED ORDER — ATROPINE SULFATE 1 MG/10ML IJ SOSY
1.0000 mg | PREFILLED_SYRINGE | INTRAMUSCULAR | Status: DC | PRN
  Administered 2018-10-12 – 2018-10-30 (×15): 1 mg via INTRAVENOUS
  Filled 2018-10-09 (×12): qty 10

## 2018-10-09 MED ORDER — MAGNESIUM CITRATE PO SOLN
0.5000 | Freq: Once | ORAL | Status: AC
  Administered 2018-10-09: 0.5
  Filled 2018-10-09: qty 296

## 2018-10-09 MED ORDER — POLYETHYLENE GLYCOL 3350 17 G PO PACK
17.0000 g | PACK | Freq: Every day | ORAL | Status: DC
  Administered 2018-10-09 – 2018-11-01 (×12): 17 g
  Filled 2018-10-09 (×15): qty 1

## 2018-10-09 MED ORDER — CHLORHEXIDINE GLUCONATE 0.12 % MT SOLN
OROMUCOSAL | Status: AC
  Administered 2018-10-09: 20:00:00 15 mL via OROMUCOSAL
  Filled 2018-10-09: qty 45

## 2018-10-09 MED ORDER — POTASSIUM CHLORIDE 20 MEQ/15ML (10%) PO SOLN
40.0000 meq | Freq: Once | ORAL | Status: AC
  Administered 2018-10-09: 10:00:00 40 meq
  Filled 2018-10-09: qty 30
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
## 2018-10-09 NOTE — Progress Notes (Signed)
Responded to Code Blue, which was canceled upon arriving on unit around 7:45 pm.

## 2018-10-10 ENCOUNTER — Inpatient Hospital Stay (HOSPITAL_COMMUNITY): Payer: No Typology Code available for payment source

## 2018-10-10 LAB — GLUCOSE, CAPILLARY
Glucose-Capillary: 124 mg/dL — ABNORMAL HIGH (ref 70–99)
Glucose-Capillary: 124 mg/dL — ABNORMAL HIGH (ref 70–99)
Glucose-Capillary: 119 mg/dL — ABNORMAL HIGH (ref 70–99)
Glucose-Capillary: 114 mg/dL — ABNORMAL HIGH (ref 70–99)
Glucose-Capillary: 133 mg/dL — ABNORMAL HIGH (ref 70–99)
Glucose-Capillary: 129 mg/dL — ABNORMAL HIGH (ref 70–99)

## 2018-10-10 LAB — CBC
HCT: 26.3 % — ABNORMAL LOW (ref 39.0–52.0)
Hemoglobin: 8.1 g/dL — ABNORMAL LOW (ref 13.0–17.0)
MCH: 30.3 pg (ref 26.0–34.0)
MCHC: 30.8 g/dL (ref 30.0–36.0)
MCV: 98.5 fL (ref 80.0–100.0)
Platelets: 232 10*3/uL (ref 150–400)
RBC: 2.67 MIL/uL — ABNORMAL LOW (ref 4.22–5.81)
RDW: 14.6 % (ref 11.5–15.5)
WBC: 21 10*3/uL — ABNORMAL HIGH (ref 4.0–10.5)
nRBC: 0.1 % (ref 0.0–0.2)

## 2018-10-10 LAB — TRIGLYCERIDES: Triglycerides: 82 mg/dL (ref <150)

## 2018-10-10 MED ORDER — PIPERACILLIN-TAZOBACTAM 3.375 G IVPB
3.3750 g | Freq: Three times a day (TID) | INTRAVENOUS | Status: DC
  Administered 2018-10-10 – 2018-10-13 (×9): 3.375 g via INTRAVENOUS
  Filled 2018-10-10 (×9): qty 50
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
## 2018-10-11 DIAGNOSIS — L8990 Pressure ulcer of unspecified site, unspecified stage: Secondary | ICD-10-CM | POA: Insufficient documentation

## 2018-10-11 LAB — CBC
HCT: 23.6 % — ABNORMAL LOW (ref 39.0–52.0)
Hemoglobin: 7.8 g/dL — ABNORMAL LOW (ref 13.0–17.0)
MCH: 33.1 pg (ref 26.0–34.0)
MCHC: 33.1 g/dL (ref 30.0–36.0)
MCV: 100 fL (ref 80.0–100.0)
Platelets: 212 10*3/uL (ref 150–400)
RBC: 2.36 MIL/uL — ABNORMAL LOW (ref 4.22–5.81)
RDW: 14.6 % (ref 11.5–15.5)
WBC: 18.2 10*3/uL — ABNORMAL HIGH (ref 4.0–10.5)
nRBC: 0 % (ref 0.0–0.2)

## 2018-10-11 LAB — BASIC METABOLIC PANEL
Anion gap: 8 (ref 5–15)
BUN: 23 mg/dL — ABNORMAL HIGH (ref 6–20)
CO2: 26 mmol/L (ref 22–32)
Calcium: 8.1 mg/dL — ABNORMAL LOW (ref 8.9–10.3)
Chloride: 107 mmol/L (ref 98–111)
Creatinine, Ser: 0.58 mg/dL — ABNORMAL LOW (ref 0.61–1.24)
GFR calc Af Amer: >60 mL/min (ref >60)
GFR calc non Af Amer: >60 mL/min (ref >60)
Glucose, Bld: 116 mg/dL — ABNORMAL HIGH (ref 70–99)
Potassium: 3.8 mmol/L (ref 3.5–5.1)
Sodium: 141 mmol/L (ref 135–145)

## 2018-10-11 LAB — POCT I-STAT 7, (LYTES, BLD GAS, ICA,H+H)
Acid-Base Excess: 6 mmol/L — ABNORMAL HIGH (ref 0.0–2.0)
Bicarbonate: 29.9 mmol/L — ABNORMAL HIGH (ref 20.0–28.0)
Calcium, Ion: 1.26 mmol/L (ref 1.15–1.40)
HCT: 28 % — ABNORMAL LOW (ref 39.0–52.0)
Hemoglobin: 9.5 g/dL — ABNORMAL LOW (ref 13.0–17.0)
O2 Saturation: 96 %
Patient temperature: 35.7
Potassium: 3.8 mmol/L (ref 3.5–5.1)
Sodium: 146 mmol/L — ABNORMAL HIGH (ref 135–145)
TCO2: 31 mmol/L (ref 22–32)
pCO2 arterial: 39.8 mmHg (ref 32.0–48.0)
pH, Arterial: 7.478 — ABNORMAL HIGH (ref 7.350–7.450)
pO2, Arterial: 70 mmHg — ABNORMAL LOW (ref 83.0–108.0)

## 2018-10-11 LAB — GLUCOSE, CAPILLARY
Glucose-Capillary: 114 mg/dL — ABNORMAL HIGH (ref 70–99)
Glucose-Capillary: 111 mg/dL — ABNORMAL HIGH (ref 70–99)
Glucose-Capillary: 130 mg/dL — ABNORMAL HIGH (ref 70–99)
Glucose-Capillary: 121 mg/dL — ABNORMAL HIGH (ref 70–99)

## 2018-10-11 MED ORDER — FUROSEMIDE 10 MG/ML IJ SOLN
40.0000 mg | Freq: Once | INTRAMUSCULAR | Status: AC
  Administered 2018-10-11: 40 mg via INTRAVENOUS
  Filled 2018-10-11: qty 4

## 2018-10-11 MED ORDER — BISACODYL 10 MG RE SUPP
10.0000 mg | Freq: Every day | RECTAL | Status: DC
  Administered 2018-10-11: 10 mg via RECTAL
  Filled 2018-10-11: qty 1

## 2018-10-11 MED ORDER — POTASSIUM CHLORIDE 20 MEQ/15ML (10%) PO SOLN
40.0000 meq | Freq: Once | ORAL | Status: AC
  Administered 2018-10-11: 40 meq
  Filled 2018-10-11: qty 30
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
## 2018-10-11 NOTE — Progress Notes (Signed)
Assisted tele visit to patient with family member.  Isabelly Kobler William, RN   
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
## 2018-10-11 NOTE — Progress Notes (Signed)
Trauma MD Redmond Pulling made aware of new found pressure injury on patient's back.

## 2018-10-12 ENCOUNTER — Inpatient Hospital Stay (HOSPITAL_COMMUNITY): Payer: No Typology Code available for payment source

## 2018-10-12 LAB — BASIC METABOLIC PANEL
Anion gap: 8 (ref 5–15)
BUN: 22 mg/dL — ABNORMAL HIGH (ref 6–20)
CO2: 27 mmol/L (ref 22–32)
Calcium: 7.9 mg/dL — ABNORMAL LOW (ref 8.9–10.3)
Chloride: 103 mmol/L (ref 98–111)
Creatinine, Ser: 0.53 mg/dL — ABNORMAL LOW (ref 0.61–1.24)
GFR calc Af Amer: >60 mL/min (ref >60)
GFR calc non Af Amer: >60 mL/min (ref >60)
Glucose, Bld: 119 mg/dL — ABNORMAL HIGH (ref 70–99)
Potassium: 4.1 mmol/L (ref 3.5–5.1)
Sodium: 138 mmol/L (ref 135–145)

## 2018-10-12 LAB — POCT I-STAT 7, (LYTES, BLD GAS, ICA,H+H)
Acid-Base Excess: 7 mmol/L — ABNORMAL HIGH (ref 0.0–2.0)
Bicarbonate: 31.5 mmol/L — ABNORMAL HIGH (ref 20.0–28.0)
Calcium, Ion: 1.25 mmol/L (ref 1.15–1.40)
HCT: 26 % — ABNORMAL LOW (ref 39.0–52.0)
Hemoglobin: 8.8 g/dL — ABNORMAL LOW (ref 13.0–17.0)
O2 Saturation: 97 %
Patient temperature: 36.2
Potassium: 3.8 mmol/L (ref 3.5–5.1)
Sodium: 144 mmol/L (ref 135–145)
TCO2: 33 mmol/L — ABNORMAL HIGH (ref 22–32)
pCO2 arterial: 45.1 mmHg (ref 32.0–48.0)
pH, Arterial: 7.449 (ref 7.350–7.450)
pO2, Arterial: 86 mmHg (ref 83.0–108.0)

## 2018-10-12 LAB — CBC
HCT: 24 % — ABNORMAL LOW (ref 39.0–52.0)
Hemoglobin: 7.8 g/dL — ABNORMAL LOW (ref 13.0–17.0)
MCH: 31.8 pg (ref 26.0–34.0)
MCHC: 32.5 g/dL (ref 30.0–36.0)
MCV: 98 fL (ref 80.0–100.0)
Platelets: 249 10*3/uL (ref 150–400)
RBC: 2.45 MIL/uL — ABNORMAL LOW (ref 4.22–5.81)
RDW: 14.6 % (ref 11.5–15.5)
WBC: 14.6 10*3/uL — ABNORMAL HIGH (ref 4.0–10.5)
nRBC: 0.1 % (ref 0.0–0.2)

## 2018-10-12 LAB — TRIGLYCERIDES: Triglycerides: 105 mg/dL (ref <150)

## 2018-10-12 LAB — GLUCOSE, CAPILLARY
Glucose-Capillary: 124 mg/dL — ABNORMAL HIGH (ref 70–99)
Glucose-Capillary: 130 mg/dL — ABNORMAL HIGH (ref 70–99)
Glucose-Capillary: 127 mg/dL — ABNORMAL HIGH (ref 70–99)
Glucose-Capillary: 122 mg/dL — ABNORMAL HIGH (ref 70–99)
Glucose-Capillary: 128 mg/dL — ABNORMAL HIGH (ref 70–99)

## 2018-10-12 MED ORDER — GABAPENTIN 250 MG/5ML PO SOLN
300.0000 mg | Freq: Three times a day (TID) | ORAL | Status: DC
  Administered 2018-10-12 – 2018-11-01 (×53): 300 mg
  Filled 2018-10-12 (×62): qty 6

## 2018-10-12 MED ORDER — FUROSEMIDE 10 MG/ML IJ SOLN
40.0000 mg | Freq: Two times a day (BID) | INTRAMUSCULAR | Status: AC
  Administered 2018-10-12 – 2018-10-13 (×2): 40 mg via INTRAVENOUS
  Filled 2018-10-12 (×2): qty 4
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
## 2018-10-12 NOTE — Progress Notes (Signed)
RT called for pt coding.  Upon entering room pt was with sats of 82% and HR.  Pt apparently brady/desat during bath.   Pt placed on 100% and sats increased to 100 quickly.  RT will continue to monitor.
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
## 2018-10-12 NOTE — Progress Notes (Signed)
Changed fentanyl tubing. Wasted 10 mL fentanyl from tubing with Hedwig Morton. RN.

## 2018-10-12 NOTE — Progress Notes (Signed)
In process of cleaning up patient from a bowel movement and noticed his O2 sats dropped to the 70's and became asystole for approximately 30 seconds. 1 mg atropine was given and ventilator was placed on 100%. His heart rate and O2 sats came back shortly after.  Paged Dr Kieth Brightly regarding the incident. No new orders placed at this time. Will continue to monitor  Hiram Gash RN

## 2018-10-13 LAB — GLUCOSE, CAPILLARY
Glucose-Capillary: 111 mg/dL — ABNORMAL HIGH (ref 70–99)
Glucose-Capillary: 114 mg/dL — ABNORMAL HIGH (ref 70–99)
Glucose-Capillary: 114 mg/dL — ABNORMAL HIGH (ref 70–99)
Glucose-Capillary: 119 mg/dL — ABNORMAL HIGH (ref 70–99)
Glucose-Capillary: 120 mg/dL — ABNORMAL HIGH (ref 70–99)
Glucose-Capillary: 137 mg/dL — ABNORMAL HIGH (ref 70–99)
Glucose-Capillary: 95 mg/dL (ref 70–99)

## 2018-10-13 LAB — CULTURE, RESPIRATORY W GRAM STAIN

## 2018-10-13 LAB — BASIC METABOLIC PANEL
Anion gap: 11 (ref 5–15)
BUN: 24 mg/dL — ABNORMAL HIGH (ref 6–20)
CO2: 30 mmol/L (ref 22–32)
Calcium: 8.6 mg/dL — ABNORMAL LOW (ref 8.9–10.3)
Chloride: 100 mmol/L (ref 98–111)
Creatinine, Ser: 0.66 mg/dL (ref 0.61–1.24)
GFR calc Af Amer: >60 mL/min (ref >60)
GFR calc non Af Amer: >60 mL/min (ref >60)
Glucose, Bld: 132 mg/dL — ABNORMAL HIGH (ref 70–99)
Potassium: 3 mmol/L — ABNORMAL LOW (ref 3.5–5.1)
Sodium: 141 mmol/L (ref 135–145)

## 2018-10-13 LAB — CBC
HCT: 25.7 % — ABNORMAL LOW (ref 39.0–52.0)
Hemoglobin: 8.1 g/dL — ABNORMAL LOW (ref 13.0–17.0)
MCH: 30.7 pg (ref 26.0–34.0)
MCHC: 31.5 g/dL (ref 30.0–36.0)
MCV: 97.3 fL (ref 80.0–100.0)
Platelets: 324 10*3/uL (ref 150–400)
RBC: 2.64 MIL/uL — ABNORMAL LOW (ref 4.22–5.81)
RDW: 14.5 % (ref 11.5–15.5)
WBC: 13.4 10*3/uL — ABNORMAL HIGH (ref 4.0–10.5)
nRBC: 0.2 % (ref 0.0–0.2)

## 2018-10-13 MED ORDER — POTASSIUM CHLORIDE 20 MEQ PO PACK
20.0000 meq | PACK | Freq: Two times a day (BID) | ORAL | Status: DC
  Administered 2018-10-13: 20 meq via ORAL
  Filled 2018-10-13: qty 1

## 2018-10-13 MED ORDER — VANCOMYCIN HCL 10 G IV SOLR
1500.0000 mg | Freq: Two times a day (BID) | INTRAVENOUS | Status: DC
  Administered 2018-10-13 – 2018-10-16 (×7): 1500 mg via INTRAVENOUS
  Filled 2018-10-13 (×9): qty 1500

## 2018-10-13 MED ORDER — PIPERACILLIN-TAZOBACTAM 3.375 G IVPB: 3.3750 g | Freq: Three times a day (TID) | INTRAVENOUS | Status: DC

## 2018-10-13 MED ORDER — VANCOMYCIN HCL 10 G IV SOLR
2000.0000 mg | Freq: Once | INTRAVENOUS | Status: AC
  Administered 2018-10-13: 2000 mg via INTRAVENOUS
  Filled 2018-10-13: qty 2000

## 2018-10-13 MED ORDER — SODIUM CHLORIDE 0.9 % IV SOLN
2.0000 g | Freq: Three times a day (TID) | INTRAVENOUS | Status: DC
  Administered 2018-10-13 – 2018-10-16 (×9): 2 g via INTRAVENOUS
  Filled 2018-10-13 (×11): qty 2

## 2018-10-13 MED ORDER — FUROSEMIDE 10 MG/ML IJ SOLN
40.0000 mg | Freq: Once | INTRAMUSCULAR | Status: AC
  Administered 2018-10-13: 40 mg via INTRAVENOUS
  Filled 2018-10-13: qty 4

## 2018-10-13 MED ORDER — POTASSIUM CHLORIDE 20 MEQ/15ML (10%) PO SOLN
40.0000 meq | Freq: Three times a day (TID) | ORAL | Status: AC
  Administered 2018-10-13 (×3): 40 meq
  Filled 2018-10-13 (×3): qty 30

## 2018-10-13 MED ORDER — BISACODYL 10 MG RE SUPP
10.0000 mg | RECTAL | Status: DC
  Filled 2018-10-13 (×2): qty 1
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
## 2018-10-13 NOTE — Progress Notes (Signed)
CRITICAL VALUE ALERT  Critical Value:  Potassium 3.0  Date & Time Notied:  0550  Provider Notified:md Kinsinger  Orders Received/Actions taken: yes
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
## 2018-10-13 NOTE — Progress Notes (Addendum)
Addendum:  Serratia in trach aspirate identified.   Plan: D/C Zosyn.  Ceftazidime 2g IV every 8 hours as discussed with Dr. Grandville Silos.  Continue Vancomycin for MRSA coverage.  Monitor clinical status.    Pharmacy Antibiotic Note  Tony Stephenson is a 38 y.o. male admitted on 2018/10/06 with MRSA PNA and few GNR growing as well.Marland Kitchen  Pharmacy has been consulted for Vancomycin and Zosyn dosing.   WBC is trending down. Tmax 100.8 last 24 hrs- currently afebrile.  SCr 0.66- stable. Good UOP.   Plan: Vancomycin 2g IV x1 then 1500mg  IV every 12 hours.  Goal AUC 400-550. Expected AUC: 487 SCr used: 0.8 Continue Zosyn 3.375g IV every 8 hours - EI until GNR identified Monitor cultures and ability to narrow Zosyn Follow up renal function and clinical status  Height: 6' (182.9 cm) Weight: 231 lb 7.7 oz (105 kg) IBW/kg (Calculated) : 77.6  Temp (24hrs), Avg:98.2 F (36.8 C), Min:97 F (36.1 C), Max:100.8 F (38.2 C)  Recent Labs  Lab 10/09/18 0406 10/09/18 1038 10/10/18 0502 10/11/18 0515 10/12/18 0545 10/13/18 0441  WBC 11.9*  --  21.0* 18.2* 14.6* 13.4*  CREATININE 0.88 0.75  --  0.58* 0.53* 0.66    Estimated Creatinine Clearance: 155.4 mL/min (by C-G formula based on SCr of 0.66 mg/dL).    No Known Allergies  Antimicrobials this admission: Zosyn 9/11 >> Rocephin 9/5>>9/10 Vanc 9/6>>9/7; 9/13 >>  Dose adjustments this admission:  Microbiology results: 9/10 TA: MRSA + few GNR 9/5Sputum: mod GPCs - moraxella catarrhalis 8/31MRSA PCR: negative  Thank you for allowing pharmacy to be a part of this patient's care.  Sloan Leiter, PharmD, BCPS, BCCCP Clinical Pharmacist Clinical phone 10/13/2018 until 3:30P [phone] Please refer to [facility] for Jud numbers 10/13/2018 8:29 AM

## 2018-10-13 NOTE — Progress Notes (Signed)
Audible sounds heard from patient's mouth, tachypnic in 30's, O2 sat's 95%. Called RT to assess for cuff leak. ETT measured 23 cm at the lip. Remo Lipps with RT advanced ETT to 26 cm. No further sounds heard around cuff and patient breathing comfortably with the vent. O2 sats remained 94-95%.

## 2018-10-14 LAB — GLUCOSE, CAPILLARY
Glucose-Capillary: 120 mg/dL — ABNORMAL HIGH (ref 70–99)
Glucose-Capillary: 126 mg/dL — ABNORMAL HIGH (ref 70–99)
Glucose-Capillary: 128 mg/dL — ABNORMAL HIGH (ref 70–99)
Glucose-Capillary: 144 mg/dL — ABNORMAL HIGH (ref 70–99)
Glucose-Capillary: 146 mg/dL — ABNORMAL HIGH (ref 70–99)

## 2018-10-14 LAB — BASIC METABOLIC PANEL
Anion gap: 9 (ref 5–15)
BUN: 23 mg/dL — ABNORMAL HIGH (ref 6–20)
CO2: 29 mmol/L (ref 22–32)
Calcium: 8.5 mg/dL — ABNORMAL LOW (ref 8.9–10.3)
Chloride: 104 mmol/L (ref 98–111)
Creatinine, Ser: 0.55 mg/dL — ABNORMAL LOW (ref 0.61–1.24)
GFR calc Af Amer: >60 mL/min (ref >60)
GFR calc non Af Amer: >60 mL/min (ref >60)
Glucose, Bld: 117 mg/dL — ABNORMAL HIGH (ref 70–99)
Potassium: 4 mmol/L (ref 3.5–5.1)
Sodium: 142 mmol/L (ref 135–145)

## 2018-10-14 LAB — CBC
HCT: 26.1 % — ABNORMAL LOW (ref 39.0–52.0)
Hemoglobin: 7.8 g/dL — ABNORMAL LOW (ref 13.0–17.0)
MCH: 29.9 pg (ref 26.0–34.0)
MCHC: 29.9 g/dL — ABNORMAL LOW (ref 30.0–36.0)
MCV: 100 fL (ref 80.0–100.0)
Platelets: 374 10*3/uL (ref 150–400)
RBC: 2.61 MIL/uL — ABNORMAL LOW (ref 4.22–5.81)
RDW: 14.5 % (ref 11.5–15.5)
WBC: 13.9 10*3/uL — ABNORMAL HIGH (ref 4.0–10.5)
nRBC: 0.3 % — ABNORMAL HIGH (ref 0.0–0.2)

## 2018-10-14 LAB — TRIGLYCERIDES: Triglycerides: 65 mg/dL (ref <150)

## 2018-10-14 MED ORDER — FUROSEMIDE 10 MG/ML IJ SOLN
40.0000 mg | Freq: Two times a day (BID) | INTRAMUSCULAR | Status: AC
  Administered 2018-10-14 (×2): 40 mg via INTRAVENOUS
  Filled 2018-10-14 (×2): qty 4

## 2018-10-14 MED ORDER — EPINEPHRINE 1 MG/10ML IJ SOSY
1.0000 mg | PREFILLED_SYRINGE | Freq: Once | INTRAMUSCULAR | Status: AC
  Administered 2018-10-14: 06:00:00 1 mg via INTRAVENOUS

## 2018-10-14 NOTE — Progress Notes (Signed)
[facility] Progress Note Patient Name: Tony Stephenson DOB: Feb 21, 1980 MRN: [id number]   Date of Service  10/14/2018  HPI/Events of Note  Patient became bradycardic and went asystolic when turned. Patient bagged and given Epinephrine and Atropine --> ROSC.   eICU Interventions  Continue present care. Further management per Trauma service.      Intervention Category Major Interventions: Code management / supervision  [name],Steven Cornelia Copa 10/14/2018, 5:42 AM

## 2018-10-14 NOTE — Progress Notes (Signed)
Patient coded while lying on left side. Staff was attempting to change linens due to patient having large bowel movement. Patient tolerated lying on side for approximately 2 minutes, but then he became bradycardic. At that time atropine was administered at 0535. Code blue called and compressions began at 0535. Epinephrine administered at 0538. ROSC achieved at 0538 with sinus tachycardia on monitor. Dr. Grandville Silos notified of event.

## 2018-10-14 NOTE — Code Documentation (Signed)
  Patient Name: Tony Stephenson   MRN: [id number]   Date of Birth/ Sex: [birth year] , male      Admission Date: 2018/10/13  Attending Provider: Md, Trauma, MD  Primary Diagnosis: <principal problem not specified>   Indication: Pt was in his usual state of health until this AM, when he was noted to be a cardiac that progressed to asystole while trying to clean the patient. Code blue was subsequently called. At the time of arrival on scene, ACLS protocol was underway.   Technical Description:  - CPR performance duration:  2 minute  - Was defibrillation or cardioversion used? No   - Was external pacer placed? No  - Was patient intubated pre/post CPR? Yes   Medications Administered: Y = Yes; Blank = No Amiodarone    Atropine  Y  Calcium    Epinephrine  Y  Lidocaine    Magnesium    Norepinephrine    Phenylephrine    Sodium bicarbonate    Vasopressin     Post CPR evaluation:  - Final Status - Was patient successfully resuscitated ? Yes - What is current rhythm?  Sinus tachycardia - What is current hemodynamic status?  Stable  Miscellaneous Information:  - Labs sent, including:  None  - Primary team notified?  Yes  - Family Notified? No  - Additional notes/ transfer status: In ICU     Marianna Payment, MD  10/14/2018, 6:15 AM
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
## 2018-10-14 NOTE — Progress Notes (Signed)
   10/14/18 0800  Clinical Encounter Type  Visited With Health care provider;Patient not available  Visit Type Initial;Code  Referral From Other (Comment) (code blue pg)   Present for support for code blue.  Temple Pacini [place], [phone]

## 2018-10-14 NOTE — Progress Notes (Signed)
Spoke with mother who is talking with the rest of the family about changing code status. All questions answered. -she would like to talk with the chaplain and social worker

## 2018-10-14 NOTE — Progress Notes (Signed)
Call from pt's dad, Ceejay Kegley.[phone]) He wants to speak with a Dr regarding pt's prognosis. He has been receiving updates from pt's mother, but he feels he would need to hear straight from the Dr, and preferably see his son face to face before he could make any "decisions" about end of life. Social work consult has been ordered.

## 2018-10-14 NOTE — Consult Note (Signed)
[place] Nurse wound follow up Patient receiving care in [facility] 4N21 Attempted to re-evaluate the left posterior shoulder wound, however, the primary RN states that when the patient is turned he goes bradycardic, then asystole.  Re-assessment of the area deferred until such a time that the patient is more stable. Val Riles, RN, MSN, CWOCN, CNS-BC, pager [phone]

## 2018-10-14 NOTE — Progress Notes (Signed)
Responded to spiritual care consult.  Nurse will have a chaplain paged when family is present to speak with Tony Stephenson and his family.

## 2018-10-15 LAB — CBC
HCT: 23.8 % — ABNORMAL LOW (ref 39.0–52.0)
Hemoglobin: 7.6 g/dL — ABNORMAL LOW (ref 13.0–17.0)
MCH: 31.5 pg (ref 26.0–34.0)
MCHC: 31.9 g/dL (ref 30.0–36.0)
MCV: 98.8 fL (ref 80.0–100.0)
Platelets: 354 10*3/uL (ref 150–400)
RBC: 2.41 MIL/uL — ABNORMAL LOW (ref 4.22–5.81)
RDW: 14.5 % (ref 11.5–15.5)
WBC: 14.6 10*3/uL — ABNORMAL HIGH (ref 4.0–10.5)
nRBC: 0 % (ref 0.0–0.2)

## 2018-10-15 LAB — GLUCOSE, CAPILLARY
Glucose-Capillary: 110 mg/dL — ABNORMAL HIGH (ref 70–99)
Glucose-Capillary: 113 mg/dL — ABNORMAL HIGH (ref 70–99)
Glucose-Capillary: 119 mg/dL — ABNORMAL HIGH (ref 70–99)
Glucose-Capillary: 127 mg/dL — ABNORMAL HIGH (ref 70–99)
Glucose-Capillary: 128 mg/dL — ABNORMAL HIGH (ref 70–99)
Glucose-Capillary: 144 mg/dL — ABNORMAL HIGH (ref 70–99)

## 2018-10-15 LAB — BASIC METABOLIC PANEL WITH GFR
Anion gap: 10 (ref 5–15)
BUN: 28 mg/dL — ABNORMAL HIGH (ref 6–20)
CO2: 30 mmol/L (ref 22–32)
Calcium: 8.7 mg/dL — ABNORMAL LOW (ref 8.9–10.3)
Chloride: 104 mmol/L (ref 98–111)
Creatinine, Ser: 0.66 mg/dL (ref 0.61–1.24)
GFR calc Af Amer: >60 mL/min
GFR calc non Af Amer: >60 mL/min
Glucose, Bld: 132 mg/dL — ABNORMAL HIGH (ref 70–99)
Potassium: 3.4 mmol/L — ABNORMAL LOW (ref 3.5–5.1)
Sodium: 144 mmol/L (ref 135–145)

## 2018-10-15 NOTE — Plan of Care (Signed)
  Problem: Education: Goal: Knowledge of the prescribed therapeutic regimen will improve Outcome: Not Progressing   Problem: Coping: Goal: Ability to verbalize feelings will improve Outcome: Not Progressing   Problem: Self-Care: Goal: Ability to participate in self-care as condition permits will improve Outcome: Not Progressing
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
## 2018-10-15 NOTE — Progress Notes (Signed)
CPT held due to pt status.

## 2018-10-15 NOTE — Progress Notes (Signed)
25 mL fentanyl from bag wasted with RN [name] in trash.

## 2018-10-15 NOTE — Progress Notes (Addendum)
When removing bite block patient coded. Bagged patient until HR returned. Placed patient on 100% O2 12 PEEP. CPT held due to patient's status.

## 2018-10-15 NOTE — Progress Notes (Signed)
Patient ID: Tony Stephenson, male   DOB: [birth year], 38 y.o.   MRN: [id number] I met with his mother, father, stepmother, brother, and sister along with his nurse to discuss his current situation and goals of care.  After long discussion, they decided to make him DNR for now.  They are strongly considering terminal extubation.  They will let us know if they make their mind.  Georganna Skeans, MD, MPH, FACS Trauma & General Surgery: [phone]

## 2018-10-15 NOTE — Progress Notes (Signed)
Patient ID: Tony Stephenson, male   DOB: 23-May-1980, 39 y.o.   MRN: [id number] I met with his mother, father, step mother and sister. I updated them on his progressive multiorgan failure despite maximal efforts. We discussed goals of care. I suggested DNR and transition to comfort care to allow [name] a natural death. They are waiting for [name]'s brother to arrive from out of town this afternoon.  Georganna Skeans, MD, MPH, FACS Trauma & General Surgery: [phone]

## 2018-10-15 NOTE — Progress Notes (Signed)
CPT held at this time due to bradycardiac episode.
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
## 2018-10-15 NOTE — Progress Notes (Signed)
CDS called: Referral number: [id number]

## 2018-10-15 NOTE — Progress Notes (Signed)
Patient ID: Tony Stephenson, male   DOB: 1980/03/21, 38 y.o.   MRN: [id number] Cardiac arrest with staff in room. ROSC after epi x1 and atropine x1. I called his mother and she is on her way in to discuss goals of care. I asked her to call Tony Stephenson as well.  Georganna Skeans, MD, MPH, FACS Trauma & General Surgery: [phone]

## 2018-10-16 LAB — GLUCOSE, CAPILLARY
Glucose-Capillary: 105 mg/dL — ABNORMAL HIGH (ref 70–99)
Glucose-Capillary: 109 mg/dL — ABNORMAL HIGH (ref 70–99)
Glucose-Capillary: 117 mg/dL — ABNORMAL HIGH (ref 70–99)
Glucose-Capillary: 119 mg/dL — ABNORMAL HIGH (ref 70–99)
Glucose-Capillary: 84 mg/dL (ref 70–99)

## 2018-10-16 LAB — BASIC METABOLIC PANEL
Anion gap: 10 (ref 5–15)
BUN: 39 mg/dL — ABNORMAL HIGH (ref 6–20)
CO2: 28 mmol/L (ref 22–32)
Calcium: 8.4 mg/dL — ABNORMAL LOW (ref 8.9–10.3)
Chloride: 107 mmol/L (ref 98–111)
Creatinine, Ser: 0.87 mg/dL (ref 0.61–1.24)
GFR calc Af Amer: >60 mL/min (ref >60)
GFR calc non Af Amer: >60 mL/min (ref >60)
Glucose, Bld: 136 mg/dL — ABNORMAL HIGH (ref 70–99)
Potassium: 3.6 mmol/L (ref 3.5–5.1)
Sodium: 145 mmol/L (ref 135–145)

## 2018-10-16 LAB — TRIGLYCERIDES: Triglycerides: 1388 mg/dL — ABNORMAL HIGH (ref <150)

## 2018-10-16 LAB — VANCOMYCIN, PEAK: Vancomycin Pk: 37 ug/mL (ref 30–40)

## 2018-10-16 LAB — VANCOMYCIN, TROUGH: Vancomycin Tr: 22 ug/mL (ref 15–20)

## 2018-10-16 MED ORDER — VANCOMYCIN HCL IN DEXTROSE 1-5 GM/200ML-% IV SOLN
1000.0000 mg | Freq: Two times a day (BID) | INTRAVENOUS | Status: DC
  Administered 2018-10-17 – 2018-10-21 (×10): 1000 mg via INTRAVENOUS
  Filled 2018-10-16 (×9): qty 200

## 2018-10-16 MED ORDER — SODIUM CHLORIDE 0.9 % IV SOLN
2.0000 g | Freq: Three times a day (TID) | INTRAVENOUS | Status: DC
  Administered 2018-10-16 – 2018-10-28 (×35): 2 g via INTRAVENOUS
  Filled 2018-10-16 (×39): qty 2

## 2018-10-16 MED FILL — Medication: Qty: 1 | Status: AC

## 2018-10-16 NOTE — Progress Notes (Signed)
CPT held at this time due to patient's current condition. Been having bradycardic episodes.  Kathie Dike RRT

## 2018-10-16 NOTE — Consult Note (Signed)
[place] Nurse wound follow up Patient receiving care in [facility] 4N21 Attempted to re-evaluate the left posterior shoulder wound;  primary RN states that when the patient is turned he goes bradycardic, then asystole.  Re-assessment of the area deferred until such a time that the patient is more stable. Julien Girt MSN, RN, [place], [place], [place]

## 2018-10-16 NOTE — Progress Notes (Signed)
Pharmacy Antibiotic Note  Tony Stephenson is a 38 y.o. male admitted on 09/17/2018 with MRSA PNA and few GNR growing as well.Marland Kitchen  Pharmacy has been consulted for Vancomycin dosing.  9/16: Vancomycin peak 37, vancomycin trough 22, calculated AUC 733 supratherapeutic.  Plan: Change vancomycin to 1000mg  IV every 12 hours (estimated AUC 488) Monitor renal function, LOT Vancomycin levels as needed  Height: 6' (182.9 cm) Weight: 215 lb 2.7 oz (97.6 kg) IBW/kg (Calculated) : 77.6  Temp (24hrs), Avg:99.7 F (37.6 C), Min:99 F (37.2 C), Max:100.4 F (38 C)  Recent Labs  Lab 10/11/18 0515 10/12/18 0545 10/13/18 0441 10/14/18 0513 10/15/18 0500 10/16/18 0728 10/16/18 1349 10/16/18 2123  WBC 18.2* 14.6* 13.4* 13.9* 14.6*  --   --   --   CREATININE 0.58* 0.53* 0.66 0.55* 0.66 0.87  --   --   VANCOTROUGH  --   --   --   --   --   --   --  22*  VANCOPEAK  --   --   --   --   --   --  37  --     Estimated Creatinine Clearance: 138 mL/min (by C-G formula based on SCr of 0.87 mg/dL).    No Known Allergies  Bertis Ruddy, PharmD Clinical Pharmacist Please check AMION for all [place] numbers 10/16/2018 10:27 PM
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
## 2018-10-16 NOTE — Progress Notes (Signed)
CPT held due to bradycardiac event.

## 2018-10-17 ENCOUNTER — Inpatient Hospital Stay (HOSPITAL_COMMUNITY): Payer: No Typology Code available for payment source

## 2018-10-17 LAB — GLUCOSE, CAPILLARY
Glucose-Capillary: 122 mg/dL — ABNORMAL HIGH (ref 70–99)
Glucose-Capillary: 134 mg/dL — ABNORMAL HIGH (ref 70–99)
Glucose-Capillary: 139 mg/dL — ABNORMAL HIGH (ref 70–99)
Glucose-Capillary: 139 mg/dL — ABNORMAL HIGH (ref 70–99)
Glucose-Capillary: 137 mg/dL — ABNORMAL HIGH (ref 70–99)

## 2018-10-17 LAB — BASIC METABOLIC PANEL
Anion gap: 7 (ref 5–15)
BUN: 31 mg/dL — ABNORMAL HIGH (ref 6–20)
CO2: 27 mmol/L (ref 22–32)
Calcium: 7.6 mg/dL — ABNORMAL LOW (ref 8.9–10.3)
Chloride: 105 mmol/L (ref 98–111)
Creatinine, Ser: 0.68 mg/dL (ref 0.61–1.24)
GFR calc Af Amer: >60 mL/min (ref >60)
GFR calc non Af Amer: >60 mL/min (ref >60)
Glucose, Bld: 101 mg/dL — ABNORMAL HIGH (ref 70–99)
Potassium: 4 mmol/L (ref 3.5–5.1)
Sodium: 139 mmol/L (ref 135–145)

## 2018-10-17 MED ORDER — LORAZEPAM 2 MG/ML IJ SOLN
1.0000 mg | INTRAMUSCULAR | Status: DC | PRN
  Administered 2018-10-17 – 2018-10-19 (×5): 1 mg via INTRAVENOUS
  Filled 2018-10-17 (×5): qty 1

## 2018-10-17 MED ORDER — FUROSEMIDE 10 MG/ML IJ SOLN
40.0000 mg | Freq: Once | INTRAMUSCULAR | Status: AC
  Administered 2018-10-17: 40 mg via INTRAVENOUS
  Filled 2018-10-17: qty 4

## 2018-10-17 NOTE — Progress Notes (Signed)
Propofol off today at 1357 due to high triglycerides.  Pt became anxious shortly after, PRN ativan given & effective
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
## 2018-10-18 ENCOUNTER — Inpatient Hospital Stay (HOSPITAL_COMMUNITY): Payer: No Typology Code available for payment source

## 2018-10-18 LAB — GLUCOSE, CAPILLARY
Glucose-Capillary: 124 mg/dL — ABNORMAL HIGH (ref 70–99)
Glucose-Capillary: 132 mg/dL — ABNORMAL HIGH (ref 70–99)
Glucose-Capillary: 137 mg/dL — ABNORMAL HIGH (ref 70–99)
Glucose-Capillary: 165 mg/dL — ABNORMAL HIGH (ref 70–99)

## 2018-10-18 LAB — CBC
HCT: 17.4 % — ABNORMAL LOW (ref 39.0–52.0)
Hemoglobin: 5.2 g/dL — CL (ref 13.0–17.0)
MCH: 30.4 pg (ref 26.0–34.0)
MCHC: 29.9 g/dL — ABNORMAL LOW (ref 30.0–36.0)
MCV: 101.8 fL — ABNORMAL HIGH (ref 80.0–100.0)
Platelets: 337 10*3/uL (ref 150–400)
RBC: 1.71 MIL/uL — ABNORMAL LOW (ref 4.22–5.81)
RDW: 15.1 % (ref 11.5–15.5)
WBC: 12.2 10*3/uL — ABNORMAL HIGH (ref 4.0–10.5)
nRBC: 0.7 % — ABNORMAL HIGH (ref 0.0–0.2)

## 2018-10-18 LAB — BASIC METABOLIC PANEL
Anion gap: 9 (ref 5–15)
BUN: 42 mg/dL — ABNORMAL HIGH (ref 6–20)
CO2: 27 mmol/L (ref 22–32)
Calcium: 8.8 mg/dL — ABNORMAL LOW (ref 8.9–10.3)
Chloride: 114 mmol/L — ABNORMAL HIGH (ref 98–111)
Creatinine, Ser: 0.8 mg/dL (ref 0.61–1.24)
GFR calc Af Amer: >60 mL/min (ref >60)
GFR calc non Af Amer: >60 mL/min (ref >60)
Glucose, Bld: 141 mg/dL — ABNORMAL HIGH (ref 70–99)
Potassium: 3.8 mmol/L (ref 3.5–5.1)
Sodium: 150 mmol/L — ABNORMAL HIGH (ref 135–145)

## 2018-10-18 LAB — TRIGLYCERIDES
Triglycerides: 76 mg/dL (ref <150)
Triglycerides: 92 mg/dL (ref <150)

## 2018-10-18 LAB — PREPARE RBC (CROSSMATCH)

## 2018-10-18 MED ORDER — FREE WATER
200.0000 mL | Freq: Three times a day (TID) | Status: DC
  Administered 2018-10-18 – 2018-10-20 (×8): 200 mL

## 2018-10-18 MED ORDER — ALTEPLASE 2 MG IJ SOLR
2.0000 mg | Freq: Once | INTRAMUSCULAR | Status: AC
  Administered 2018-10-18: 2 mg

## 2018-10-18 MED ORDER — SODIUM CHLORIDE 0.9% IV SOLUTION: Freq: Once | INTRAVENOUS | Status: DC

## 2018-10-18 NOTE — Progress Notes (Signed)
Patient ID: Tony Stephenson, male   DOB: 18-Jul-1980, 38 y.o.   MRN: [id number] Hb 5.2. TF 2u PRBC. Georganna Skeans, MD, MPH, FACS Trauma & General Surgery: [phone]

## 2018-10-18 NOTE — Progress Notes (Signed)
CRITICAL VALUE ALERT  Critical Value:  hgb 5.2  Date & Time Notied:  9/18  1005  Provider Notified: Dr. Grandville Silos  Orders Received/Actions taken:  Will put in PRBCs order
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
## 2018-10-18 NOTE — Progress Notes (Signed)
RT NOTE: RT was in room with patient as CPT was being performed and patient's nebulizer treatments were running. RT suctioned patient at which point patient bradyed down and then coded. Code blue was called and compressions were started with return of pulse after one minute. Vitals are now stable. RT will continue to monitor.
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
## 2018-10-18 NOTE — Progress Notes (Signed)
Per RT, pt was suctioned and bradyed down and went asystole at 0728.  Code blue called and compressions started and continued for 1 minute until ROSC.  Pt bradyed again and atropine given at 0729.  HR NSR and oxygen sat low 90s.  Dr.Thompson notified and at bedside, pt expressed he would like [place] to continue full code.

## 2018-10-18 NOTE — Progress Notes (Signed)
RT unable to perform scheduled CPT d/t pts bradycardia at this time. RT will reassess next rounds for CPT. RT will continue to monitor.

## 2018-10-19 ENCOUNTER — Encounter (HOSPITAL_COMMUNITY): Payer: Self-pay

## 2018-10-19 ENCOUNTER — Other Ambulatory Visit: Payer: Self-pay

## 2018-10-19 LAB — CBC
HCT: 22.6 % — ABNORMAL LOW (ref 39.0–52.0)
Hemoglobin: 7.1 g/dL — ABNORMAL LOW (ref 13.0–17.0)
MCH: 30.7 pg (ref 26.0–34.0)
MCHC: 31.4 g/dL (ref 30.0–36.0)
MCV: 97.8 fL (ref 80.0–100.0)
Platelets: 353 10*3/uL (ref 150–400)
RBC: 2.31 MIL/uL — ABNORMAL LOW (ref 4.22–5.81)
RDW: 16.7 % — ABNORMAL HIGH (ref 11.5–15.5)
WBC: 13.5 10*3/uL — ABNORMAL HIGH (ref 4.0–10.5)
nRBC: 1 % — ABNORMAL HIGH (ref 0.0–0.2)

## 2018-10-19 LAB — BASIC METABOLIC PANEL
Anion gap: 8 (ref 5–15)
BUN: 40 mg/dL — ABNORMAL HIGH (ref 6–20)
CO2: 27 mmol/L (ref 22–32)
Calcium: 8.4 mg/dL — ABNORMAL LOW (ref 8.9–10.3)
Chloride: 115 mmol/L — ABNORMAL HIGH (ref 98–111)
Creatinine, Ser: 0.72 mg/dL (ref 0.61–1.24)
GFR calc Af Amer: >60 mL/min (ref >60)
GFR calc non Af Amer: >60 mL/min (ref >60)
Glucose, Bld: 139 mg/dL — ABNORMAL HIGH (ref 70–99)
Potassium: 3.6 mmol/L (ref 3.5–5.1)
Sodium: 150 mmol/L — ABNORMAL HIGH (ref 135–145)

## 2018-10-19 LAB — BPAM RBC
Blood Product Expiration Date: 202010202359
Blood Product Expiration Date: 202010232359
ISSUE DATE / TIME: 202009181435
ISSUE DATE / TIME: 202009181640
Unit Type and Rh: 5100
Unit Type and Rh: 5100

## 2018-10-19 LAB — GLUCOSE, CAPILLARY
Glucose-Capillary: 143 mg/dL — ABNORMAL HIGH (ref 70–99)
Glucose-Capillary: 143 mg/dL — ABNORMAL HIGH (ref 70–99)
Glucose-Capillary: 141 mg/dL — ABNORMAL HIGH (ref 70–99)
Glucose-Capillary: 124 mg/dL — ABNORMAL HIGH (ref 70–99)
Glucose-Capillary: 123 mg/dL — ABNORMAL HIGH (ref 70–99)
Glucose-Capillary: 124 mg/dL — ABNORMAL HIGH (ref 70–99)

## 2018-10-19 LAB — TYPE AND SCREEN
ABO/RH(D): O POS
Antibody Screen: NEGATIVE
Unit division: 0
Unit division: 0

## 2018-10-19 MED ORDER — LORAZEPAM 2 MG/ML IJ SOLN
2.0000 mg | INTRAMUSCULAR | Status: DC | PRN
  Administered 2018-10-19 – 2018-10-22 (×7): 2 mg via INTRAVENOUS
  Filled 2018-10-19 (×8): qty 1

## 2018-10-19 MED ORDER — INFLUENZA VAC SPLIT QUAD 0.5 ML IM SUSY
0.5000 mL | PREFILLED_SYRINGE | INTRAMUSCULAR | Status: AC
  Administered 2018-10-20: 0.5 mL via INTRAMUSCULAR
  Filled 2018-10-19: qty 0.5

## 2018-10-19 NOTE — Progress Notes (Signed)
During handoff, patient went bradycardic and went asystole for approximately 45 seconds. Atropine was given and patient's heart rate improved. Will notify Trauma doctor when he rounds.  Will continue to monitor   Hiram Gash RN

## 2018-10-19 NOTE — Progress Notes (Signed)
Patient with episode of bradycardia with brief period of asystole at 1944 while RT was in room doing maintenance on the ventilator. This RN did enter room immediately but patient HR improved without intervention. Will continue to monitor.  This RN called patient's mother to update her on event.  Candy Sledge, RN
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
## 2018-10-20 ENCOUNTER — Inpatient Hospital Stay (HOSPITAL_COMMUNITY): Payer: No Typology Code available for payment source

## 2018-10-20 LAB — BASIC METABOLIC PANEL
Anion gap: 10 (ref 5–15)
BUN: 44 mg/dL — ABNORMAL HIGH (ref 6–20)
CO2: 28 mmol/L (ref 22–32)
Calcium: 9.1 mg/dL (ref 8.9–10.3)
Chloride: 112 mmol/L — ABNORMAL HIGH (ref 98–111)
Creatinine, Ser: 0.72 mg/dL (ref 0.61–1.24)
GFR calc Af Amer: >60 mL/min (ref >60)
GFR calc non Af Amer: >60 mL/min (ref >60)
Glucose, Bld: 127 mg/dL — ABNORMAL HIGH (ref 70–99)
Potassium: 4 mmol/L (ref 3.5–5.1)
Sodium: 150 mmol/L — ABNORMAL HIGH (ref 135–145)

## 2018-10-20 LAB — GLUCOSE, CAPILLARY
Glucose-Capillary: 106 mg/dL — ABNORMAL HIGH (ref 70–99)
Glucose-Capillary: 111 mg/dL — ABNORMAL HIGH (ref 70–99)
Glucose-Capillary: 115 mg/dL — ABNORMAL HIGH (ref 70–99)
Glucose-Capillary: 119 mg/dL — ABNORMAL HIGH (ref 70–99)
Glucose-Capillary: 127 mg/dL — ABNORMAL HIGH (ref 70–99)
Glucose-Capillary: 134 mg/dL — ABNORMAL HIGH (ref 70–99)

## 2018-10-20 LAB — CBC
HCT: 23.1 % — ABNORMAL LOW (ref 39.0–52.0)
Hemoglobin: 7.1 g/dL — ABNORMAL LOW (ref 13.0–17.0)
MCH: 30.6 pg (ref 26.0–34.0)
MCHC: 30.7 g/dL (ref 30.0–36.0)
MCV: 99.6 fL (ref 80.0–100.0)
Platelets: 361 10*3/uL (ref 150–400)
RBC: 2.32 MIL/uL — ABNORMAL LOW (ref 4.22–5.81)
RDW: 16.5 % — ABNORMAL HIGH (ref 11.5–15.5)
WBC: 12.3 10*3/uL — ABNORMAL HIGH (ref 4.0–10.5)
nRBC: 1.5 % — ABNORMAL HIGH (ref 0.0–0.2)

## 2018-10-20 NOTE — Progress Notes (Signed)
CPT being held at this time due to increased pressures, desat and possible aspiration of tube feed.  RT will continue to monitor.

## 2018-10-20 NOTE — Progress Notes (Signed)
Patient went asystole on the monitor. RN went into room and patient had disconnected from the ventilator. Pt placed back on vent and Atropine given. Heart rate went back to NSR. Pt's father was on hold during the event. RN updated him when able to return to the call. He asked many questions about how long these events would occur and how his organs were functioning. Karely Hurtado, Rande Brunt, RN
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
## 2018-10-20 NOTE — Progress Notes (Signed)
RT called d/t desat and possible cuff leak.  RT asked RN to fill balloon with 1-2 cc until RT could arrive.  RT arrived and found pt at 88% on 80% FiO2 and 12 of peep.  RT noted tan liquid in pt mouth and suctioned to remove.  Maybe tube feed, and possible aspiration if cuff was deflated.  RT suggested cxr.  Awaiting further instructions at this time.

## 2018-10-21 LAB — POCT I-STAT 7, (LYTES, BLD GAS, ICA,H+H)
Acid-Base Excess: 2 mmol/L (ref 0.0–2.0)
Acid-Base Excess: 3 mmol/L — ABNORMAL HIGH (ref 0.0–2.0)
Bicarbonate: 30.6 mmol/L — ABNORMAL HIGH (ref 20.0–28.0)
Bicarbonate: 30.8 mmol/L — ABNORMAL HIGH (ref 20.0–28.0)
Calcium, Ion: 1.38 mmol/L (ref 1.15–1.40)
Calcium, Ion: 1.33 mmol/L (ref 1.15–1.40)
HCT: 23 % — ABNORMAL LOW (ref 39.0–52.0)
HCT: 24 % — ABNORMAL LOW (ref 39.0–52.0)
Hemoglobin: 7.8 g/dL — ABNORMAL LOW (ref 13.0–17.0)
Hemoglobin: 8.2 g/dL — ABNORMAL LOW (ref 13.0–17.0)
O2 Saturation: 89 %
O2 Saturation: 89 %
Patient temperature: 102.5
Patient temperature: 100.3
Potassium: 4.7 mmol/L (ref 3.5–5.1)
Potassium: 4.6 mmol/L (ref 3.5–5.1)
Sodium: 151 mmol/L — ABNORMAL HIGH (ref 135–145)
Sodium: 152 mmol/L — ABNORMAL HIGH (ref 135–145)
TCO2: 33 mmol/L — ABNORMAL HIGH (ref 22–32)
TCO2: 33 mmol/L — ABNORMAL HIGH (ref 22–32)
pCO2 arterial: 85.4 mmHg (ref 32.0–48.0)
pCO2 arterial: 69.8 mmHg (ref 32.0–48.0)
pH, Arterial: 7.175 — CL (ref 7.350–7.450)
pH, Arterial: 7.258 — ABNORMAL LOW (ref 7.350–7.450)
pO2, Arterial: 82 mmHg — ABNORMAL LOW (ref 83.0–108.0)
pO2, Arterial: 70 mmHg — ABNORMAL LOW (ref 83.0–108.0)

## 2018-10-21 LAB — GLUCOSE, CAPILLARY
Glucose-Capillary: 103 mg/dL — ABNORMAL HIGH (ref 70–99)
Glucose-Capillary: 104 mg/dL — ABNORMAL HIGH (ref 70–99)
Glucose-Capillary: 109 mg/dL — ABNORMAL HIGH (ref 70–99)
Glucose-Capillary: 113 mg/dL — ABNORMAL HIGH (ref 70–99)
Glucose-Capillary: 115 mg/dL — ABNORMAL HIGH (ref 70–99)

## 2018-10-21 LAB — HEPARIN LEVEL (UNFRACTIONATED)
Heparin Unfractionated: <0.1 [IU]/mL — ABNORMAL LOW (ref 0.30–0.70)
Heparin Unfractionated: 0.24 [IU]/mL — ABNORMAL LOW (ref 0.30–0.70)

## 2018-10-21 LAB — D-DIMER, QUANTITATIVE: D-Dimer, Quant: >20 ug/mL-FEU — ABNORMAL HIGH (ref 0.00–0.50)

## 2018-10-21 LAB — VANCOMYCIN, TROUGH: Vancomycin Tr: 24 ug/mL (ref 15–20)

## 2018-10-21 LAB — VANCOMYCIN, PEAK: Vancomycin Pk: 39 ug/mL (ref 30–40)

## 2018-10-21 MED ORDER — FUROSEMIDE 10 MG/ML IJ SOLN
INTRAMUSCULAR | Status: AC
  Administered 2018-10-21: 02:00:00 20 mg via INTRAVENOUS
  Filled 2018-10-21: qty 2

## 2018-10-21 MED ORDER — VANCOMYCIN HCL 10 G IV SOLR
1250.0000 mg | INTRAVENOUS | Status: DC
  Administered 2018-10-22 – 2018-10-27 (×6): 1250 mg via INTRAVENOUS
  Filled 2018-10-21 (×7): qty 1250

## 2018-10-21 MED ORDER — SODIUM CHLORIDE 0.45 % IV SOLN
INTRAVENOUS | Status: DC
  Administered 2018-10-21: 13:00:00 via INTRAVENOUS
  Filled 2018-10-21 (×3): qty 1000

## 2018-10-21 MED ORDER — HEPARIN BOLUS VIA INFUSION
3000.0000 [IU] | Freq: Once | INTRAVENOUS | Status: AC
  Administered 2018-10-21: 3000 [IU] via INTRAVENOUS
  Filled 2018-10-21: qty 3000

## 2018-10-21 MED ORDER — ALBUTEROL SULFATE (2.5 MG/3ML) 0.083% IN NEBU: 2.5000 mg | INHALATION_SOLUTION | Freq: Four times a day (QID) | RESPIRATORY_TRACT | Status: DC | PRN

## 2018-10-21 MED ORDER — ACETAMINOPHEN 650 MG RE SUPP
650.0000 mg | Freq: Four times a day (QID) | RECTAL | Status: DC | PRN
  Administered 2018-10-21: 650 mg via RECTAL
  Filled 2018-10-21: qty 1

## 2018-10-21 MED ORDER — HEPARIN (PORCINE) 25000 UT/250ML-% IV SOLN
2050.0000 [IU]/h | INTRAVENOUS | Status: DC
  Administered 2018-10-21: 1500 [IU]/h via INTRAVENOUS
  Administered 2018-10-21: 1800 [IU]/h via INTRAVENOUS
  Filled 2018-10-21 (×3): qty 250

## 2018-10-21 MED ORDER — HEPARIN BOLUS VIA INFUSION
2400.0000 [IU] | Freq: Once | INTRAVENOUS | Status: AC
  Administered 2018-10-21: 13:00:00 2400 [IU] via INTRAVENOUS
  Filled 2018-10-21: qty 2400

## 2018-10-21 MED ORDER — FUROSEMIDE 10 MG/ML IJ SOLN
20.0000 mg | Freq: Once | INTRAMUSCULAR | Status: AC
  Administered 2018-10-21: 02:00:00 20 mg via INTRAVENOUS

## 2018-10-21 NOTE — Progress Notes (Signed)
RT note: patient does not meet SBT criteria this AM due to PEEP and FIO2.  Tolerating current ventilator settings well.  Will continue to monitor.

## 2018-10-21 NOTE — Progress Notes (Signed)
D-dimer > 20. Verbal order from Dr. Rosendo Gros to consult pharmacy for heparin gtt.
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
## 2018-10-21 NOTE — Progress Notes (Signed)
Dr. Rosendo Gros called and updated pt's mother about his persistently low o2 saturation tonight. At time of note, pt currently at 92%
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
## 2018-10-21 NOTE — Progress Notes (Signed)
RT holding CPT again at this time d/t pt not stable.  RT will continue to monitor.
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
## 2018-10-21 NOTE — Progress Notes (Signed)
950 mLs of green liquid suctioned from OG tube; remains on ILWS

## 2018-10-21 NOTE — Progress Notes (Signed)
Patient ID: Tony Stephenson, male   DOB: August 10, 1980, 38 y.o.   MRN: [id number] I met with his mother at the bedside and his father was on speaker phone. I updated them as to how poorly Tony Stephenson is doing despite maximal efforts. Tony Stephenson was able to communicate that he still wants to be a full code. RN checking if father can visit again.  Georganna Skeans, MD, MPH, FACS Trauma & General Surgery: [phone]

## 2018-10-21 NOTE — Progress Notes (Signed)
Pt progressively declining.  RT called to observe with possibility pt would code.  RT was asked to administer neb tx and obtain ABG.  RT present and awaiting further instructions.

## 2018-10-21 NOTE — Consult Note (Signed)
[facility] Nurse wound follow up Patient receiving care in [place]. I was hoping to re-eval the posterior left shoulder wound, however, in speaking with his primary RN, Amy, I learned that not only does he continue to have asystolic events with turning, he is also struggling to maintain oxygen saturation.  Follow up of wound is impossible at this time. Val Riles, RN, MSN, CWOCN, CNS-BC, pager [phone]

## 2018-10-21 NOTE — Progress Notes (Signed)
Continue to hold tube feedings per Dr. Grandville Silos, will switch tylenol from per tube to rectal for fever

## 2018-10-21 NOTE — Progress Notes (Signed)
Pt with increased Vent support for decreasing O2 sats. Pt  @100 % and PEEP12. Pt off of Lovenox since 18th for decreased Hgb.  Transfused at that time. Pt would be too unstable at this time to lay flat and undergo CT PE protocol given his history of bradycardia and asystole with desats. D-Dimer pending currently.  If appears to be  C/w PE will began on heparin gtt.

## 2018-10-22 ENCOUNTER — Inpatient Hospital Stay (HOSPITAL_COMMUNITY): Payer: No Typology Code available for payment source

## 2018-10-22 ENCOUNTER — Inpatient Hospital Stay (HOSPITAL_COMMUNITY): Payer: No Typology Code available for payment source | Admitting: Anesthesiology

## 2018-10-22 LAB — BASIC METABOLIC PANEL
Anion gap: 10 (ref 5–15)
BUN: 48 mg/dL — ABNORMAL HIGH (ref 6–20)
CO2: 34 mmol/L — ABNORMAL HIGH (ref 22–32)
Calcium: 8.5 mg/dL — ABNORMAL LOW (ref 8.9–10.3)
Chloride: 113 mmol/L — ABNORMAL HIGH (ref 98–111)
Creatinine, Ser: 0.93 mg/dL (ref 0.61–1.24)
GFR calc Af Amer: >60 mL/min (ref >60)
GFR calc non Af Amer: >60 mL/min (ref >60)
Glucose, Bld: 112 mg/dL — ABNORMAL HIGH (ref 70–99)
Potassium: 3.6 mmol/L (ref 3.5–5.1)
Sodium: 157 mmol/L — ABNORMAL HIGH (ref 135–145)

## 2018-10-22 LAB — PREPARE RBC (CROSSMATCH)

## 2018-10-22 LAB — CULTURE, BLOOD (ROUTINE X 2)
Culture: NO GROWTH
Culture: NO GROWTH
Special Requests: ADEQUATE
Special Requests: ADEQUATE

## 2018-10-22 LAB — CBC
HCT: 22.5 % — ABNORMAL LOW (ref 39.0–52.0)
Hemoglobin: 6.7 g/dL — CL (ref 13.0–17.0)
MCH: 30.5 pg (ref 26.0–34.0)
MCHC: 29.8 g/dL — ABNORMAL LOW (ref 30.0–36.0)
MCV: 102.3 fL — ABNORMAL HIGH (ref 80.0–100.0)
Platelets: 304 10*3/uL (ref 150–400)
RBC: 2.2 MIL/uL — ABNORMAL LOW (ref 4.22–5.81)
RDW: 16 % — ABNORMAL HIGH (ref 11.5–15.5)
WBC: 15.4 10*3/uL — ABNORMAL HIGH (ref 4.0–10.5)
nRBC: 0.4 % — ABNORMAL HIGH (ref 0.0–0.2)

## 2018-10-22 LAB — HEPARIN LEVEL (UNFRACTIONATED): Heparin Unfractionated: 0.22 [IU]/mL — ABNORMAL LOW (ref 0.30–0.70)

## 2018-10-22 LAB — GLUCOSE, CAPILLARY
Glucose-Capillary: 103 mg/dL — ABNORMAL HIGH (ref 70–99)
Glucose-Capillary: 111 mg/dL — ABNORMAL HIGH (ref 70–99)
Glucose-Capillary: 97 mg/dL (ref 70–99)
Glucose-Capillary: 98 mg/dL (ref 70–99)

## 2018-10-22 MED ORDER — POTASSIUM CHLORIDE IN NACL 20-0.45 MEQ/L-% IV SOLN
INTRAVENOUS | Status: DC
  Administered 2018-10-22 (×2): via INTRAVENOUS
  Filled 2018-10-22 (×2): qty 1000

## 2018-10-22 MED ORDER — ALBUMIN HUMAN 5 % IV SOLN
12.5000 g | Freq: Once | INTRAVENOUS | Status: AC
  Administered 2018-10-22: 11:00:00 12.5 g via INTRAVENOUS
  Filled 2018-10-22: qty 250

## 2018-10-22 MED ORDER — FREE WATER
300.0000 mL | Freq: Three times a day (TID) | Status: DC
  Administered 2018-10-23 – 2018-10-24 (×3): 300 mL

## 2018-10-22 MED ORDER — ROCURONIUM 10MG/ML (10ML) SYRINGE FOR MEDFUSION PUMP - OPTIME
INTRAVENOUS | Status: DC | PRN
  Administered 2018-10-22: 60 mg via INTRAVENOUS

## 2018-10-22 MED ORDER — LORAZEPAM 2 MG/ML IJ SOLN
2.0000 mg | INTRAMUSCULAR | Status: DC | PRN
  Administered 2018-10-22 (×2): 2 mg via INTRAVENOUS
  Filled 2018-10-22 (×2): qty 1

## 2018-10-22 MED ORDER — PROPOFOL 10 MG/ML IV BOLUS
INTRAVENOUS | Status: DC | PRN
  Administered 2018-10-22: 40 ug/kg/min via INTRAVENOUS

## 2018-10-22 MED ORDER — SODIUM CHLORIDE 0.9% IV SOLUTION: Freq: Once | INTRAVENOUS | Status: DC

## 2018-10-22 NOTE — Progress Notes (Signed)
CRITICAL VALUE ALERT  Critical Value:  Hemoglobin 6.7  Date & Time Notied:  0620 10/22/2018  Provider Notified: Trauma MD - Georgette Dover   Orders Received/Actions taken: 2 units PRBC's to be given

## 2018-10-22 NOTE — Anesthesia Procedure Notes (Signed)
Procedure Name: Intubation Date/Time: 10/22/2018 4:50 AM Performed by: Valetta Fuller, CRNA Pre-anesthesia Checklist: Patient identified, Emergency Drugs available, Suction available and Patient being monitored Patient Re-evaluated:Patient Re-evaluated prior to induction Oxygen Delivery Method: Circle system utilized Preoxygenation: Pre-oxygenation with 100% oxygen Induction Type: IV induction Laryngoscope Size: Glidescope Number of attempts: 1 Airway Equipment and Method: Bougie stylet Placement Confirmation: ETT inserted through vocal cords under direct vision and CO2 detector Secured at: 24 cm Tube secured with: Tape Dental Injury: Teeth and Oropharynx as per pre-operative assessment  Comments: Pilot balloon on existing ETT had ruptured. DL with Glidescope and Bougie stylet down ETT. Exchanged without difficulty.
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
## 2018-10-22 NOTE — Progress Notes (Signed)
Pt became increasingly agitated after asking for water & I explained to him couldn't have water or stand up. Fentanyl gtt maxed out and ativan still had 1 more hour before I could give it.  I explained his medical situation with where his paralysis was and the dangers of taking out the breathing tube. He began biting on the tube and attempting to push it out with his tongue.   R. [place] went to room after the ventilator was alarming, patient was asking for water and additional education given about how he couldn't have water, pt spoke around ET tube "yes I can". He then became bradycardiac and went asystole, R.[place] gave atropine and HR increased.  RT explained that he may have ruptured the cuff and needed to be switched; trauma MD paged for anesthesia and increase in sedation.   ETT was succesfully exchanged, anesthesia recommended increased sedation, trauma was notified- no further orders received.  Parents notified.

## 2018-10-23 ENCOUNTER — Inpatient Hospital Stay (HOSPITAL_COMMUNITY): Payer: No Typology Code available for payment source | Admitting: Anesthesiology

## 2018-10-23 ENCOUNTER — Inpatient Hospital Stay (HOSPITAL_COMMUNITY): Payer: No Typology Code available for payment source

## 2018-10-23 LAB — GLUCOSE, CAPILLARY
Glucose-Capillary: 92 mg/dL (ref 70–99)
Glucose-Capillary: 86 mg/dL (ref 70–99)
Glucose-Capillary: 92 mg/dL (ref 70–99)
Glucose-Capillary: 105 mg/dL — ABNORMAL HIGH (ref 70–99)
Glucose-Capillary: 105 mg/dL — ABNORMAL HIGH (ref 70–99)
Glucose-Capillary: 119 mg/dL — ABNORMAL HIGH (ref 70–99)

## 2018-10-23 LAB — BPAM RBC
Blood Product Expiration Date: 202010242359
Blood Product Expiration Date: 202010242359
ISSUE DATE / TIME: 202009220803
ISSUE DATE / TIME: 202009221005
Unit Type and Rh: 5100
Unit Type and Rh: 5100

## 2018-10-23 LAB — POCT I-STAT 7, (LYTES, BLD GAS, ICA,H+H)
Acid-Base Excess: 15 mmol/L — ABNORMAL HIGH (ref 0.0–2.0)
Bicarbonate: 41 mmol/L — ABNORMAL HIGH (ref 20.0–28.0)
Calcium, Ion: 1.22 mmol/L (ref 1.15–1.40)
HCT: 38 % — ABNORMAL LOW (ref 39.0–52.0)
Hemoglobin: 12.9 g/dL — ABNORMAL LOW (ref 13.0–17.0)
O2 Saturation: 93 %
Patient temperature: 100.1
Potassium: 3.3 mmol/L — ABNORMAL LOW (ref 3.5–5.1)
Sodium: 162 mmol/L (ref 135–145)
TCO2: 43 mmol/L — ABNORMAL HIGH (ref 22–32)
pCO2 arterial: 55.9 mmHg — ABNORMAL HIGH (ref 32.0–48.0)
pH, Arterial: 7.476 — ABNORMAL HIGH (ref 7.350–7.450)
pO2, Arterial: 67 mmHg — ABNORMAL LOW (ref 83.0–108.0)

## 2018-10-23 LAB — BASIC METABOLIC PANEL
Anion gap: 11 (ref 5–15)
Anion gap: 7 (ref 5–15)
BUN: 48 mg/dL — ABNORMAL HIGH (ref 6–20)
BUN: 50 mg/dL — ABNORMAL HIGH (ref 6–20)
CO2: 35 mmol/L — ABNORMAL HIGH (ref 22–32)
CO2: 36 mmol/L — ABNORMAL HIGH (ref 22–32)
Calcium: 8.6 mg/dL — ABNORMAL LOW (ref 8.9–10.3)
Calcium: 8.1 mg/dL — ABNORMAL LOW (ref 8.9–10.3)
Chloride: 117 mmol/L — ABNORMAL HIGH (ref 98–111)
Chloride: 118 mmol/L — ABNORMAL HIGH (ref 98–111)
Creatinine, Ser: 0.99 mg/dL (ref 0.61–1.24)
Creatinine, Ser: 1.05 mg/dL (ref 0.61–1.24)
GFR calc Af Amer: >60 mL/min (ref >60)
GFR calc Af Amer: >60 mL/min (ref >60)
GFR calc non Af Amer: >60 mL/min (ref >60)
GFR calc non Af Amer: >60 mL/min (ref >60)
Glucose, Bld: 98 mg/dL (ref 70–99)
Glucose, Bld: 103 mg/dL — ABNORMAL HIGH (ref 70–99)
Potassium: 3.3 mmol/L — ABNORMAL LOW (ref 3.5–5.1)
Potassium: 3.8 mmol/L (ref 3.5–5.1)
Sodium: 163 mmol/L (ref 135–145)
Sodium: 161 mmol/L (ref 135–145)

## 2018-10-23 LAB — CBC
HCT: 27.8 % — ABNORMAL LOW (ref 39.0–52.0)
Hemoglobin: 8.2 g/dL — ABNORMAL LOW (ref 13.0–17.0)
MCH: 29.9 pg (ref 26.0–34.0)
MCHC: 29.5 g/dL — ABNORMAL LOW (ref 30.0–36.0)
MCV: 101.5 fL — ABNORMAL HIGH (ref 80.0–100.0)
Platelets: 284 10*3/uL (ref 150–400)
RBC: 2.74 MIL/uL — ABNORMAL LOW (ref 4.22–5.81)
RDW: 16.4 % — ABNORMAL HIGH (ref 11.5–15.5)
WBC: 14.5 10*3/uL — ABNORMAL HIGH (ref 4.0–10.5)
nRBC: 0.4 % — ABNORMAL HIGH (ref 0.0–0.2)

## 2018-10-23 LAB — TYPE AND SCREEN
ABO/RH(D): O POS
Antibody Screen: NEGATIVE
Unit division: 0
Unit division: 0

## 2018-10-23 LAB — HEPARIN LEVEL (UNFRACTIONATED): Heparin Unfractionated: 0.14 [IU]/mL — ABNORMAL LOW (ref 0.30–0.70)

## 2018-10-23 MED ORDER — POTASSIUM CHLORIDE 2 MEQ/ML IV SOLN
INTRAVENOUS | Status: DC
  Filled 2018-10-23 (×3): qty 1000

## 2018-10-23 MED ORDER — ROCURONIUM 10MG/ML (10ML) SYRINGE FOR MEDFUSION PUMP - OPTIME
INTRAVENOUS | Status: DC | PRN
  Administered 2018-10-23: 30 mg via INTRAVENOUS

## 2018-10-23 MED ORDER — POTASSIUM CL IN DEXTROSE 5% 20 MEQ/L IV SOLN
20.0000 meq | INTRAVENOUS | Status: DC
  Administered 2018-10-23 – 2018-10-24 (×3): 20 meq via INTRAVENOUS
  Filled 2018-10-23 (×4): qty 1000

## 2018-10-23 MED ORDER — NOREPINEPHRINE 4 MG/250ML-% IV SOLN
0.0000 ug/min | INTRAVENOUS | Status: DC
  Administered 2018-10-23: 2 ug/min via INTRAVENOUS
  Administered 2018-10-24: 4 ug/min via INTRAVENOUS
  Administered 2018-10-25: 7 ug/min via INTRAVENOUS
  Administered 2018-10-25: 5 ug/min via INTRAVENOUS
  Administered 2018-10-26 – 2018-10-27 (×4): 7 ug/min via INTRAVENOUS
  Administered 2018-10-28 – 2018-10-29 (×6): 9 ug/min via INTRAVENOUS
  Administered 2018-10-30: 8 ug/min via INTRAVENOUS
  Administered 2018-10-30 (×3): 10 ug/min via INTRAVENOUS
  Administered 2018-10-31: 5 ug/min via INTRAVENOUS
  Administered 2018-10-31: 18:00:00 8 ug/min via INTRAVENOUS
  Filled 2018-10-23 (×17): qty 250
  Filled 2018-10-23: qty 500
  Filled 2018-10-23: qty 250

## 2018-10-23 MED ORDER — EPHEDRINE SULFATE 50 MG/ML IJ SOLN
INTRAMUSCULAR | Status: DC | PRN
  Administered 2018-10-23: 20 mg via INTRAVENOUS

## 2018-10-23 MED ORDER — PROPOFOL 10 MG/ML IV BOLUS
INTRAVENOUS | Status: DC | PRN
  Administered 2018-10-23 (×3): 50 mg via INTRAVENOUS

## 2018-10-23 MED ORDER — LORAZEPAM 2 MG/ML IJ SOLN
2.0000 mg | INTRAMUSCULAR | Status: DC | PRN
  Administered 2018-10-23 – 2018-10-29 (×9): 2 mg via INTRAVENOUS
  Filled 2018-10-23 (×9): qty 1

## 2018-10-23 MED ORDER — NOREPINEPHRINE 4 MG/250ML-% IV SOLN
INTRAVENOUS | Status: AC
  Filled 2018-10-23: qty 250

## 2018-10-23 MED ORDER — HEPARIN (PORCINE) 25000 UT/250ML-% IV SOLN
2500.0000 [IU]/h | INTRAVENOUS | Status: DC
  Administered 2018-10-23: 2300 [IU]/h via INTRAVENOUS
  Administered 2018-10-23: 2050 [IU]/h via INTRAVENOUS
  Administered 2018-10-24 – 2018-10-25 (×3): 2500 [IU]/h via INTRAVENOUS
  Filled 2018-10-23 (×5): qty 250
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
## 2018-10-23 NOTE — Progress Notes (Signed)
ANTICOAGULATION CONSULT NOTE - Follow Up Consult  Pharmacy Consult for Heparin Indication: r/p PE  No Known Allergies  Patient Measurements: Height: 6' (182.9 cm) Weight: 225 lb 8.5 oz (102.3 kg) IBW/kg (Calculated) : 77.6 Heparin Dosing Weight: 80 kg  Vital Signs: Temp: 100.4 F (38 C) (09/23 1700) Temp Source: Axillary (09/23 1700) BP: 108/60 (09/23 1915) Pulse Rate: 73 (09/23 1915)  Labs: Recent Labs    10/21/18 1830 10/22/18 0435 10/23/18 0329 10/23/18 0339 10/23/18 1200 10/23/18 1840  HGB  --  6.7* 12.9* 8.2*  --   --   HCT  --  22.5* 38.0* 27.8*  --   --   PLT  --  304  --  284  --   --   HEPARINUNFRC 0.24* 0.22*  --   --   --  0.14*  CREATININE  --  0.93  --  0.99 1.05  --     Estimated Creatinine Clearance: 116.9 mL/min (by C-G formula based on SCr of 1.05 mg/dL).   Assessment: Anticoag: Concern for PE due to elevated Ddimer. - D-dimer >20 9/21, too unstable for CT - Heparin off 9/22 for low Hgb - Heparin level 0.14 < goal. Hgb 6.7>> 8.2. No bleeding or problems reported by RN.  Goal of Therapy:  Heparin level 0.3-0.7 units/ml Monitor platelets by anticoagulation protocol: Yes   Plan:  Increase IV heparin to 2300 units/hr Recheck HL with AM labs Daily HL and CBC  Jenetta Wease S. Alford Highland, PharmD, [facility] Clinical Staff Pharmacist Eilene Ghazi [place]illinger 10/23/2018,7:30 PM
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
## 2018-10-23 NOTE — Anesthesia Procedure Notes (Signed)
Procedure Name: Intubation Date/Time: 10/23/2018 12:47 AM Performed by: Valetta Fuller, CRNA Pre-anesthesia Checklist: Patient identified, Emergency Drugs available, Suction available and Patient being monitored Patient Re-evaluated:Patient Re-evaluated prior to induction Oxygen Delivery Method: Circle system utilized Preoxygenation: Pre-oxygenation with 100% oxygen Induction Type: IV induction Laryngoscope Size: Glidescope Grade View: Grade I Tube size: 7.5 mm Number of attempts: 1 Airway Equipment and Method: Bougie stylet Placement Confirmation: ETT inserted through vocal cords under direct vision,  breath sounds checked- equal and bilateral and CO2 detector Secured at: 25 cm Tube secured with: Tape Dental Injury: Teeth and Oropharynx as per pre-operative assessment

## 2018-10-23 NOTE — Progress Notes (Addendum)
CRITICAL VALUE ALERT  Critical Value:  Sodium 163  Date & Time Notied:  10/23/2018 0542 & 2nd notification @ 0615  Provider Notified: Trauma MD- Kae Heller   Orders Received/Actions taken: Awaiting new orders - Dr.Connor is reviewing his chart to review for changes.

## 2018-10-23 NOTE — Progress Notes (Signed)
Patient bit tubing for cuff on ETT. Cuff unable to stay inflated. RT bagged patient until anesthesia came and changed out ETT using tube exchanger. Placement confirmed by breath sounds, Co2 detector and CXR. Patients Spo2 increased to 93%. Patient is stable at this time. RT will monitor as needed.

## 2018-10-24 LAB — GLUCOSE, CAPILLARY
Glucose-Capillary: 115 mg/dL — ABNORMAL HIGH (ref 70–99)
Glucose-Capillary: 111 mg/dL — ABNORMAL HIGH (ref 70–99)
Glucose-Capillary: 124 mg/dL — ABNORMAL HIGH (ref 70–99)
Glucose-Capillary: 112 mg/dL — ABNORMAL HIGH (ref 70–99)
Glucose-Capillary: 114 mg/dL — ABNORMAL HIGH (ref 70–99)
Glucose-Capillary: 125 mg/dL — ABNORMAL HIGH (ref 70–99)

## 2018-10-24 LAB — CBC
HCT: 27.9 % — ABNORMAL LOW (ref 39.0–52.0)
Hemoglobin: 7.8 g/dL — ABNORMAL LOW (ref 13.0–17.0)
MCH: 29.5 pg (ref 26.0–34.0)
MCHC: 28 g/dL — ABNORMAL LOW (ref 30.0–36.0)
MCV: 105.7 fL — ABNORMAL HIGH (ref 80.0–100.0)
Platelets: 253 10*3/uL (ref 150–400)
RBC: 2.64 MIL/uL — ABNORMAL LOW (ref 4.22–5.81)
RDW: 15.9 % — ABNORMAL HIGH (ref 11.5–15.5)
WBC: 11.6 10*3/uL — ABNORMAL HIGH (ref 4.0–10.5)
nRBC: 0.5 % — ABNORMAL HIGH (ref 0.0–0.2)

## 2018-10-24 LAB — HEPARIN LEVEL (UNFRACTIONATED)
Heparin Unfractionated: 0.28 [IU]/mL — ABNORMAL LOW (ref 0.30–0.70)
Heparin Unfractionated: 0.54 [IU]/mL (ref 0.30–0.70)

## 2018-10-24 MED ORDER — FREE WATER
300.0000 mL | Freq: Four times a day (QID) | Status: DC
  Administered 2018-10-24 – 2018-11-01 (×30): 300 mL

## 2018-10-24 MED ORDER — SODIUM CHLORIDE 0.45 % IV SOLN
INTRAVENOUS | Status: DC
  Administered 2018-10-24 – 2018-10-26 (×2): via INTRAVENOUS

## 2018-10-24 NOTE — Progress Notes (Signed)
Pharmacy Antibiotic Note  Tony Stephenson is a 38 y.o. male admitted on 10-02-2018 with MRSA/Serratia pneumonia. Pharmacy has been consulted for Vancomycin dosing.  WBC elevated but trending down at 11.6 today. Remains febrile with Tmax 101.4. Patient is quadriplegic with slowly worsening renal function (Scr 1.05). UOP (2.7L) yesterday stable. After discussion with trauma team, will continue antibiotics for now and re-assess length of therapy on a day-by-day basis due to declining clinical progression.   Plan: Continue Vancomycin 1250 mg IV Q24 hrs (estimated AUC 477) Continue Cefepime 2g IV Q8h Monitor renal function and length of therapy and clinical progression  Recommend discontinuing antibiotics after 14-days (through 9/26)  Height: 6' (182.9 cm) Weight: 225 lb 8.5 oz (102.3 kg) IBW/kg (Calculated) : 77.6  Temp (24hrs), Avg:100.6 F (38.1 C), Min:100 F (37.8 C), Max:101.4 F (38.6 C)  Recent Labs  Lab 10/18/18 2347 10/19/18 0552 10/20/18 0000 10/20/18 0512 10/21/18 0111 10/21/18 1115 10/22/18 0435 10/23/18 0339 10/23/18 1200 10/24/18 0447  WBC 13.5*  --  12.3*  --   --   --  15.4* 14.5*  --  11.6*  CREATININE  --  0.72  --  0.72  --   --  0.93 0.99 1.05  --   VANCOTROUGH  --   --   --   --   --  24*  --   --   --   --   VANCOPEAK  --   --   --   --  39  --   --   --   --   --     Estimated Creatinine Clearance: 116.9 mL/min (by C-G formula based on SCr of 1.05 mg/dL).    No Known Allergies  Antimicrobials this admission: Zosyn 9/11 >>9/13 Rocephin 9/5 >> 9/10 Vanc 9/6 >> 9/7; 9/13 >> Ceftaz 9/13 >>9/16 Cefepime 9/16 >>  Dose adjustments: 9/21: Vancomycin Pk 39, Tr 24, estimated AUC at current dose: 763  Microbiology results: 9/17 Bcx: ngtd 9/10 TA: MRSA (S: vanc) + Serratia (R-Ancef, S: cefepime) 9/5 Sputum: mod GPCs - moraxella catarrhalis 8/31 MRSA PCR: negative  Richardine Service, PharmD PGY1 Pharmacy Resident Phone: [phone] 10/24/2018  3:26  PM  Please check [email or web address] for unit-specific pharmacy phone numbers.
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
## 2018-10-24 NOTE — Care Management (Signed)
Pt inappropriate for SBIRT assessment at this time, as remains sedated and intubated.  Nikky Duba W. Tenzin Pavon, RN, BSN  Trauma/Neuro ICU Case Manager [phone] 

## 2018-10-24 NOTE — Progress Notes (Signed)
ANTICOAGULATION CONSULT NOTE - Follow Up Consult  Pharmacy Consult for Heparin Indication: r/o PE  No Known Allergies  Patient Measurements: Height: 6' (182.9 cm) Weight: 225 lb 8.5 oz (102.3 kg) IBW/kg (Calculated) : 77.6 Heparin Dosing Weight: 80 kg  Vital Signs: Temp: 101.4 F (38.6 C) (09/24 1200) Temp Source: Axillary (09/24 1200) BP: 107/61 (09/24 1606) Pulse Rate: 81 (09/24 1606)  Labs: Recent Labs    10/22/18 0435 10/23/18 0329 10/23/18 0339 10/23/18 1200 10/23/18 1840 10/24/18 0447 10/24/18 1514  HGB 6.7* 12.9* 8.2*  --   --  7.8*  --   HCT 22.5* 38.0* 27.8*  --   --  27.9*  --   PLT 304  --  284  --   --  253  --   HEPARINUNFRC 0.22*  --   --   --  0.14* 0.28* 0.54  CREATININE 0.93  --  0.99 1.05  --   --   --     Estimated Creatinine Clearance: 116.9 mL/min (by C-G formula based on SCr of 1.05 mg/dL).   Assessment: Anticoag: Concern for PE due to elevated Ddimer. - D-dimer >20 9/21, too unstable for CT - Heparin off 9/22 for low Hgb - Heparin level 0.14 < goal. Hgb 6.7>> 8.2. No bleeding or problems reported by RN.  9/24 pm update: Heparin level at goal (0.5) on 2500 units/hr H/H still on the low side but stable from yesterday, no bleeding noted.   Goal of Therapy:  Heparin level 0.3-0.7 units/ml Monitor platelets by anticoagulation protocol: Yes   Plan:  Continue IV heparin at 2500 units/hr Recheck level in am  Erin Hearing PharmD., BCPS Clinical Pharmacist 10/24/2018 4:31 PM

## 2018-10-24 NOTE — Progress Notes (Signed)
ANTICOAGULATION CONSULT NOTE - Follow Up Consult  Pharmacy Consult for Heparin Indication: r/o PE  No Known Allergies  Patient Measurements: Height: 6' (182.9 cm) Weight: 225 lb 8.5 oz (102.3 kg) IBW/kg (Calculated) : 77.6 Heparin Dosing Weight: 80 kg  Vital Signs: Temp: 100 F (37.8 C) (09/24 0400) Temp Source: Axillary (09/24 0400) BP: 95/52 (09/24 0438) Pulse Rate: 78 (09/24 0438)  Labs: Recent Labs    10/22/18 0435 10/23/18 0329 10/23/18 0339 10/23/18 1200 10/23/18 1840 10/24/18 0447  HGB 6.7* 12.9* 8.2*  --   --  7.8*  HCT 22.5* 38.0* 27.8*  --   --  27.9*  PLT 304  --  284  --   --  253  HEPARINUNFRC 0.22*  --   --   --  0.14* 0.28*  CREATININE 0.93  --  0.99 1.05  --   --     Estimated Creatinine Clearance: 116.9 mL/min (by C-G formula based on SCr of 1.05 mg/dL).   Assessment: Anticoag: Concern for PE due to elevated Ddimer. - D-dimer >20 9/21, too unstable for CT - Heparin off 9/22 for low Hgb - Heparin level 0.14 < goal. Hgb 6.7>> 8.2. No bleeding or problems reported by RN.  9/24 AM update: Heparin level just below goal H/H still on the low side but stable from yesterday  Goal of Therapy:  Heparin level 0.3-0.7 units/ml Monitor platelets by anticoagulation protocol: Yes   Plan:  Increase IV heparin to 2500 units/hr Re-check heparin level at [place], PharmD, [place] Pharmacist Phone: [phone]

## 2018-10-25 ENCOUNTER — Inpatient Hospital Stay (HOSPITAL_COMMUNITY): Payer: No Typology Code available for payment source

## 2018-10-25 LAB — BASIC METABOLIC PANEL
Anion gap: 7 (ref 5–15)
BUN: 54 mg/dL — ABNORMAL HIGH (ref 6–20)
CO2: 29 mmol/L (ref 22–32)
Calcium: 7.4 mg/dL — ABNORMAL LOW (ref 8.9–10.3)
Chloride: 120 mmol/L — ABNORMAL HIGH (ref 98–111)
Creatinine, Ser: 1.15 mg/dL (ref 0.61–1.24)
GFR calc Af Amer: >60 mL/min (ref >60)
GFR calc non Af Amer: >60 mL/min (ref >60)
Glucose, Bld: 129 mg/dL — ABNORMAL HIGH (ref 70–99)
Potassium: 3.5 mmol/L (ref 3.5–5.1)
Sodium: 156 mmol/L — ABNORMAL HIGH (ref 135–145)

## 2018-10-25 LAB — CBC
HCT: 28.4 % — ABNORMAL LOW (ref 39.0–52.0)
Hemoglobin: 7.8 g/dL — ABNORMAL LOW (ref 13.0–17.0)
MCH: 29 pg (ref 26.0–34.0)
MCHC: 27.5 g/dL — ABNORMAL LOW (ref 30.0–36.0)
MCV: 105.6 fL — ABNORMAL HIGH (ref 80.0–100.0)
Platelets: 212 10*3/uL (ref 150–400)
RBC: 2.69 MIL/uL — ABNORMAL LOW (ref 4.22–5.81)
RDW: 15.8 % — ABNORMAL HIGH (ref 11.5–15.5)
WBC: 8.8 10*3/uL (ref 4.0–10.5)
nRBC: 0.7 % — ABNORMAL HIGH (ref 0.0–0.2)

## 2018-10-25 LAB — HEPARIN LEVEL (UNFRACTIONATED): Heparin Unfractionated: 0.47 [IU]/mL (ref 0.30–0.70)

## 2018-10-25 LAB — GLUCOSE, CAPILLARY
Glucose-Capillary: 120 mg/dL — ABNORMAL HIGH (ref 70–99)
Glucose-Capillary: 127 mg/dL — ABNORMAL HIGH (ref 70–99)
Glucose-Capillary: 144 mg/dL — ABNORMAL HIGH (ref 70–99)
Glucose-Capillary: 122 mg/dL — ABNORMAL HIGH (ref 70–99)
Glucose-Capillary: 115 mg/dL — ABNORMAL HIGH (ref 70–99)
Glucose-Capillary: 113 mg/dL — ABNORMAL HIGH (ref 70–99)

## 2018-10-25 MED ORDER — POTASSIUM CL IN DEXTROSE 5% 20 MEQ/L IV SOLN
20.0000 meq | INTRAVENOUS | Status: DC
  Administered 2018-10-25: 20 meq via INTRAVENOUS
  Filled 2018-10-25: qty 1000

## 2018-10-25 MED ORDER — IBUPROFEN 100 MG/5ML PO SUSP
600.0000 mg | Freq: Three times a day (TID) | ORAL | Status: DC | PRN
  Administered 2018-10-26: 600 mg via ORAL
  Filled 2018-10-25 (×2): qty 30
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
## 2018-10-25 NOTE — Progress Notes (Signed)
Patient had episode of tan colored emesis. Tube feed held. Dr. Grandville Silos aware. [name] RN BSN

## 2018-10-25 NOTE — Progress Notes (Signed)
Patient ID: Tony Stephenson, male   DOB: July 18, 1980, 38 y.o.   MRN: [id number] Bedside echocardiogram done. No evidence of significant PE. D/C heparin. I D/W his mother at the bedside.  Patient examined and I agree with the assessment and plan  Georganna Skeans, MD, MPH, [facility] Trauma & General Surgery: [phone]  10/25/2018 3:58 PM

## 2018-10-25 NOTE — Progress Notes (Signed)
Rt noticed pt has some breakdown on left corner of bottom lip from bite block.  Unable to remove bite block due to patient continued biting and has even had tube changed due to it.  Tried to reposition the bite block and used some gauze as a cushion to protect it.  RN made aware of this also.  RT will continue to monitor.

## 2018-10-25 NOTE — Progress Notes (Signed)
ANTICOAGULATION CONSULT NOTE - Follow Up Consult  Pharmacy Consult for Heparin Indication: r/o PE  No Known Allergies  Patient Measurements: Height: 6' (182.9 cm) Weight: 225 lb 8.5 oz (102.3 kg) IBW/kg (Calculated) : 77.6 Heparin Dosing Weight: 80 kg  Vital Signs: Temp: 102.7 F (39.3 C) (09/25 0400) BP: 104/59 (09/25 1148) Pulse Rate: 74 (09/25 1148)  Labs: Recent Labs    10/23/18 0339 10/23/18 1200  10/24/18 0447 10/24/18 1514 10/25/18 0423  HGB 8.2*  --   --  7.8*  --  7.8*  HCT 27.8*  --   --  27.9*  --  28.4*  PLT 284  --   --  253  --  212  HEPARINUNFRC  --   --    < > 0.28* 0.54 0.47  CREATININE 0.99 1.05  --   --   --  1.15   < > = values in this interval not displayed.    Estimated Creatinine Clearance: 106.7 mL/min (by C-G formula based on SCr of 1.15 mg/dL).   Assessment: Anticoag: Concern for PE due to elevated Ddimer. - D-dimer >20 9/21, too unstable for CTA - Heparin off 9/22 for low Hgb, restarted 9/23   Heparin level therapeutic at 0.47. Hgb low but stable at 7.8. Plts WNL at 212. No bleeding or infusion issues noted. Patient is still too unstable for CTA of chest. Per trauma team, will plan cardiac ECHO to assess evidence of PE - if none, will plan to stop heparin.  Goal of Therapy:  Heparin level 0.3-0.7 units/ml Monitor platelets by anticoagulation protocol: Yes   Plan:  Continue IV heparin at 2500 units/hr Daily HL and CBC Monitor bleeding F/u ECHO to r/o PE  Richardine Service, PharmD PGY1 Pharmacy Resident Phone: [phone] 10/25/2018  12:05 PM  Please check [email or web address] for unit-specific pharmacy phone numbers.

## 2018-10-26 ENCOUNTER — Inpatient Hospital Stay (HOSPITAL_COMMUNITY): Payer: No Typology Code available for payment source

## 2018-10-26 LAB — GLUCOSE, CAPILLARY
Glucose-Capillary: 112 mg/dL — ABNORMAL HIGH (ref 70–99)
Glucose-Capillary: 116 mg/dL — ABNORMAL HIGH (ref 70–99)
Glucose-Capillary: 114 mg/dL — ABNORMAL HIGH (ref 70–99)
Glucose-Capillary: 119 mg/dL — ABNORMAL HIGH (ref 70–99)
Glucose-Capillary: 107 mg/dL — ABNORMAL HIGH (ref 70–99)
Glucose-Capillary: 113 mg/dL — ABNORMAL HIGH (ref 70–99)

## 2018-10-26 LAB — CBC
HCT: 26.7 % — ABNORMAL LOW (ref 39.0–52.0)
Hemoglobin: 7.7 g/dL — ABNORMAL LOW (ref 13.0–17.0)
MCH: 29.8 pg (ref 26.0–34.0)
MCHC: 28.8 g/dL — ABNORMAL LOW (ref 30.0–36.0)
MCV: 103.5 fL — ABNORMAL HIGH (ref 80.0–100.0)
Platelets: 178 10*3/uL (ref 150–400)
RBC: 2.58 MIL/uL — ABNORMAL LOW (ref 4.22–5.81)
RDW: 15.5 % (ref 11.5–15.5)
WBC: 8.7 10*3/uL (ref 4.0–10.5)
nRBC: 0.2 % (ref 0.0–0.2)

## 2018-10-26 LAB — BASIC METABOLIC PANEL
Anion gap: 8 (ref 5–15)
BUN: 53 mg/dL — ABNORMAL HIGH (ref 6–20)
CO2: 27 mmol/L (ref 22–32)
Calcium: 7.4 mg/dL — ABNORMAL LOW (ref 8.9–10.3)
Chloride: 120 mmol/L — ABNORMAL HIGH (ref 98–111)
Creatinine, Ser: 1.22 mg/dL (ref 0.61–1.24)
GFR calc Af Amer: >60 mL/min (ref >60)
GFR calc non Af Amer: >60 mL/min (ref >60)
Glucose, Bld: 106 mg/dL — ABNORMAL HIGH (ref 70–99)
Potassium: 3.8 mmol/L (ref 3.5–5.1)
Sodium: 155 mmol/L — ABNORMAL HIGH (ref 135–145)

## 2018-10-26 LAB — HEPARIN LEVEL (UNFRACTIONATED): Heparin Unfractionated: <0.1 [IU]/mL — ABNORMAL LOW (ref 0.30–0.70)

## 2018-10-26 MED ORDER — IBUPROFEN 100 MG/5ML PO SUSP
600.0000 mg | Freq: Three times a day (TID) | ORAL | Status: DC | PRN
  Administered 2018-10-29 – 2018-11-01 (×4): 600 mg
  Filled 2018-10-26 (×4): qty 30
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
## 2018-10-26 NOTE — Progress Notes (Signed)
Break down on bottom lip from bite block is worse than last night.  PT seems to be in pain from it.  Blue bite block removed, face and lips cleaned, tube holder changed and new clear bite block placed.  Pt seems to be tolerating this one better and it is not resting on the sores on either side of his bottom lip.  RN notified and made aware also.  RT will continue to monitor.

## 2018-10-27 LAB — CBC
HCT: 29.9 % — ABNORMAL LOW (ref 39.0–52.0)
Hemoglobin: 8.5 g/dL — ABNORMAL LOW (ref 13.0–17.0)
MCH: 29.5 pg (ref 26.0–34.0)
MCHC: 28.4 g/dL — ABNORMAL LOW (ref 30.0–36.0)
MCV: 103.8 fL — ABNORMAL HIGH (ref 80.0–100.0)
Platelets: 155 10*3/uL (ref 150–400)
RBC: 2.88 MIL/uL — ABNORMAL LOW (ref 4.22–5.81)
RDW: 15.3 % (ref 11.5–15.5)
WBC: 10.3 10*3/uL (ref 4.0–10.5)
nRBC: 0 % (ref 0.0–0.2)

## 2018-10-27 LAB — GLUCOSE, CAPILLARY
Glucose-Capillary: 120 mg/dL — ABNORMAL HIGH (ref 70–99)
Glucose-Capillary: 143 mg/dL — ABNORMAL HIGH (ref 70–99)
Glucose-Capillary: 129 mg/dL — ABNORMAL HIGH (ref 70–99)
Glucose-Capillary: 140 mg/dL — ABNORMAL HIGH (ref 70–99)
Glucose-Capillary: 134 mg/dL — ABNORMAL HIGH (ref 70–99)
Glucose-Capillary: 143 mg/dL — ABNORMAL HIGH (ref 70–99)

## 2018-10-27 MED ORDER — ENOXAPARIN SODIUM 40 MG/0.4ML ~~LOC~~ SOLN
40.0000 mg | SUBCUTANEOUS | Status: DC
  Administered 2018-10-27 – 2018-11-01 (×6): 40 mg via SUBCUTANEOUS
  Filled 2018-10-27 (×6): qty 0.4
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
## 2018-10-27 NOTE — Progress Notes (Signed)
Pharmacy Antibiotic Note  Tony Stephenson is a 38 y.o. male admitted on 07-Oct-2018 with MRSA/Serratia pneumonia. Pharmacy has been consulted for Vancomycin dosing.  Day 15 of antibiotic course. WBC 10.3 today. Remains febrile with Tmax 103.9. Patient is quadriplegic with slowly worsening renal function (Scr 1.22). UOP (2.7L) yesterday stable. After discussion with trauma team, will continue antibiotics for now and re-assess length of therapy on a day-by-day basis due to declining clinical progression.   Plan: Continue Vancomycin 1250 mg IV Q24 hrs (estimated AUC 477) Continue Cefepime 2g IV Q8h Monitor renal function and length of therapy and clinical progression If vanc to continue, would check levels  Height: 6' (182.9 cm) Weight: 225 lb 8.5 oz (102.3 kg) IBW/kg (Calculated) : 77.6  Temp (24hrs), Avg:101 F (38.3 C), Min:99.7 F (37.6 C), Max:103.9 F (39.9 C)  Recent Labs  Lab 10/21/18 0111 10/21/18 1115  10/22/18 0435 10/23/18 0339 10/23/18 1200 10/24/18 0447 10/25/18 0423 10/26/18 0508 10/27/18 0529  WBC  --   --    < > 15.4* 14.5*  --  11.6* 8.8 8.7 10.3  CREATININE  --   --   --  0.93 0.99 1.05  --  1.15 1.22  --   VANCOTROUGH  --  24*  --   --   --   --   --   --   --   --   VANCOPEAK 39  --   --   --   --   --   --   --   --   --    < > = values in this interval not displayed.    Estimated Creatinine Clearance: 100.6 mL/min (by C-G formula based on SCr of 1.22 mg/dL).    No Known Allergies  Antimicrobials this admission: Zosyn 9/11 >>9/13 Rocephin 9/5 >> 9/10 Vanc 9/6 >> 9/7; 9/13 >> Ceftaz 9/13 >>9/16 Cefepime 9/16 >>  Dose adjustments: 9/21: Vancomycin Pk 39, Tr 24, estimated AUC at current dose: 763  Microbiology results: 9/17 Bcx: ngtd 9/10 TA: MRSA (S: vanc) + Serratia (R-Ancef, S: cefepime) 9/5 Sputum: mod GPCs - moraxella catarrhalis 8/31 MRSA PCR: negative  Tony Stephenson, PharmD, [facility] Clinical Pharmacist Please see AMION for all  Pharmacists' Contact Phone Numbers 10/27/2018, 10:08 AM

## 2018-10-27 NOTE — Progress Notes (Signed)
Called to patient room by RN for continued dropping of O2 sats.  Pt increased to 100%, deep suctioned with no improvement.  Recruitment done and sats increased to 99%.  RT will continue to monitor.

## 2018-10-28 LAB — BASIC METABOLIC PANEL
Anion gap: 7 (ref 5–15)
BUN: 60 mg/dL — ABNORMAL HIGH (ref 6–20)
CO2: 25 mmol/L (ref 22–32)
Calcium: 7.6 mg/dL — ABNORMAL LOW (ref 8.9–10.3)
Chloride: 125 mmol/L — ABNORMAL HIGH (ref 98–111)
Creatinine, Ser: 1.29 mg/dL — ABNORMAL HIGH (ref 0.61–1.24)
GFR calc Af Amer: >60 mL/min (ref >60)
GFR calc non Af Amer: >60 mL/min (ref >60)
Glucose, Bld: 119 mg/dL — ABNORMAL HIGH (ref 70–99)
Potassium: 3.7 mmol/L (ref 3.5–5.1)
Sodium: 157 mmol/L — ABNORMAL HIGH (ref 135–145)

## 2018-10-28 LAB — CBC
HCT: 26.3 % — ABNORMAL LOW (ref 39.0–52.0)
Hemoglobin: 7.2 g/dL — ABNORMAL LOW (ref 13.0–17.0)
MCH: 28.8 pg (ref 26.0–34.0)
MCHC: 27.4 g/dL — ABNORMAL LOW (ref 30.0–36.0)
MCV: 105.2 fL — ABNORMAL HIGH (ref 80.0–100.0)
Platelets: 114 10*3/uL — ABNORMAL LOW (ref 150–400)
RBC: 2.5 MIL/uL — ABNORMAL LOW (ref 4.22–5.81)
RDW: 15.3 % (ref 11.5–15.5)
WBC: 7.4 10*3/uL (ref 4.0–10.5)
nRBC: 0 % (ref 0.0–0.2)

## 2018-10-28 LAB — GLUCOSE, CAPILLARY
Glucose-Capillary: 137 mg/dL — ABNORMAL HIGH (ref 70–99)
Glucose-Capillary: 123 mg/dL — ABNORMAL HIGH (ref 70–99)
Glucose-Capillary: 116 mg/dL — ABNORMAL HIGH (ref 70–99)
Glucose-Capillary: 120 mg/dL — ABNORMAL HIGH (ref 70–99)
Glucose-Capillary: 130 mg/dL — ABNORMAL HIGH (ref 70–99)
Glucose-Capillary: 125 mg/dL — ABNORMAL HIGH (ref 70–99)

## 2018-10-28 MED ORDER — DEXTROSE 5 % IV SOLN
INTRAVENOUS | Status: DC
  Administered 2018-10-28 – 2018-11-01 (×2): via INTRAVENOUS

## 2018-10-28 MED ORDER — POTASSIUM CHLORIDE 20 MEQ/15ML (10%) PO SOLN
40.0000 meq | Freq: Two times a day (BID) | ORAL | Status: DC
  Administered 2018-10-28 – 2018-10-30 (×5): 40 meq
  Filled 2018-10-28 (×5): qty 30

## 2018-10-28 MED ORDER — ALBUMIN HUMAN 25 % IV SOLN
12.5000 g | Freq: Once | INTRAVENOUS | Status: AC
  Administered 2018-10-28: 12.5 g via INTRAVENOUS
  Filled 2018-10-28: qty 100

## 2018-10-28 MED ORDER — FUROSEMIDE 10 MG/ML IJ SOLN
40.0000 mg | Freq: Two times a day (BID) | INTRAMUSCULAR | Status: AC
  Administered 2018-10-28 – 2018-10-29 (×2): 40 mg via INTRAVENOUS
  Filled 2018-10-28 (×2): qty 4

## 2018-10-28 MED ORDER — PIVOT 1.5 CAL PO LIQD
1000.0000 mL | ORAL | Status: DC
  Administered 2018-10-28 – 2018-11-01 (×3): 1000 mL
  Filled 2018-10-28 (×5): qty 1000
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
## 2018-10-29 LAB — GLUCOSE, CAPILLARY
Glucose-Capillary: 135 mg/dL — ABNORMAL HIGH (ref 70–99)
Glucose-Capillary: 140 mg/dL — ABNORMAL HIGH (ref 70–99)
Glucose-Capillary: 140 mg/dL — ABNORMAL HIGH (ref 70–99)
Glucose-Capillary: 135 mg/dL — ABNORMAL HIGH (ref 70–99)
Glucose-Capillary: 145 mg/dL — ABNORMAL HIGH (ref 70–99)
Glucose-Capillary: 129 mg/dL — ABNORMAL HIGH (ref 70–99)

## 2018-10-29 LAB — BASIC METABOLIC PANEL
Anion gap: 6 (ref 5–15)
BUN: 61 mg/dL — ABNORMAL HIGH (ref 6–20)
CO2: 24 mmol/L (ref 22–32)
Calcium: 7 mg/dL — ABNORMAL LOW (ref 8.9–10.3)
Chloride: 124 mmol/L — ABNORMAL HIGH (ref 98–111)
Creatinine, Ser: 1.35 mg/dL — ABNORMAL HIGH (ref 0.61–1.24)
GFR calc Af Amer: >60 mL/min (ref >60)
GFR calc non Af Amer: >60 mL/min (ref >60)
Glucose, Bld: 139 mg/dL — ABNORMAL HIGH (ref 70–99)
Potassium: 4 mmol/L (ref 3.5–5.1)
Sodium: 154 mmol/L — ABNORMAL HIGH (ref 135–145)

## 2018-10-29 LAB — CBC
HCT: 24.2 % — ABNORMAL LOW (ref 39.0–52.0)
Hemoglobin: 7 g/dL — ABNORMAL LOW (ref 13.0–17.0)
MCH: 30.3 pg (ref 26.0–34.0)
MCHC: 28.9 g/dL — ABNORMAL LOW (ref 30.0–36.0)
MCV: 104.8 fL — ABNORMAL HIGH (ref 80.0–100.0)
Platelets: 120 10*3/uL — ABNORMAL LOW (ref 150–400)
RBC: 2.31 MIL/uL — ABNORMAL LOW (ref 4.22–5.81)
RDW: 15.3 % (ref 11.5–15.5)
WBC: 7 10*3/uL (ref 4.0–10.5)
nRBC: 0 % (ref 0.0–0.2)

## 2018-10-29 MED ORDER — PIPERACILLIN-TAZOBACTAM 3.375 G IVPB
3.3750 g | Freq: Three times a day (TID) | INTRAVENOUS | Status: DC
  Administered 2018-10-29 – 2018-10-31 (×6): 3.375 g via INTRAVENOUS
  Filled 2018-10-29 (×6): qty 50

## 2018-10-29 MED ORDER — VANCOMYCIN HCL 10 G IV SOLR
2000.0000 mg | Freq: Once | INTRAVENOUS | Status: AC
  Administered 2018-10-29: 2000 mg via INTRAVENOUS
  Filled 2018-10-29: qty 2000

## 2018-10-29 MED ORDER — VANCOMYCIN HCL 10 G IV SOLR
1250.0000 mg | INTRAVENOUS | Status: DC
  Administered 2018-10-30: 1250 mg via INTRAVENOUS
  Filled 2018-10-29 (×2): qty 1250

## 2018-10-29 MED ORDER — VANCOMYCIN HCL 10 G IV SOLR
2250.0000 mg | Freq: Once | INTRAVENOUS | Status: DC
  Filled 2018-10-29: qty 2250

## 2018-10-29 NOTE — Progress Notes (Signed)
RT note- recruitment repeated and fio2 decreased to 90%

## 2018-10-29 NOTE — Progress Notes (Signed)
This note also relates to the following rows which could not be included: SpO2 - Cannot attach notes to unvalidated device data  Recruitment performed

## 2018-10-29 NOTE — Progress Notes (Signed)
Pharmacy Antibiotic Note  Tony Stephenson is a 38 y.o. male admitted on 09/05/2018 with pneumonia.  Patient has received an extended course of antibiotics including 16 days of vancomycin and cefepime for MRSA and Serration pneumonia when all antibiotics were stopped 9/28. Pharmacy has been consulted to re-start Vancomycin and Zosyn dosing due to return of high fever.  Patient is febrile with Tmax of 103.5. WBC wnl and trending down from 7.4 to 7.0. Renal function continues to worsen slowly with Scr 1.35 from 1.29 yesterday.   9/21: Vancomycin Pk 39, Tr 24, estimated AUC at 1950 mg Q24 hrs: 763  Plan: Zosyn 3.375 mg IV Q8 hrs Vancomycin 2000 mg IV once, then 1250 mg IV Q24 hrs - Goal AUC 400-550 Plan to get Vancomycin levels at steady state (Friday/Saturday) Monitor cultures and sensitivities, renal function, and clinical progression   Height: 6' (182.9 cm) Weight: 225 lb 8.5 oz (102.3 kg) IBW/kg (Calculated) : 77.6  Temp (24hrs), Avg:100.3 F (37.9 C), Min:98.9 F (37.2 C), Max:103.5 F (39.7 C)  Recent Labs  Lab 10/23/18 1200  10/25/18 0423 10/26/18 0508 10/27/18 0529 10/28/18 0500 10/28/18 1240 10/29/18 0500  WBC  --    < > 8.8 8.7 10.3 7.4  --  7.0  CREATININE 1.05  --  1.15 1.22  --   --  1.29* 1.35*   < > = values in this interval not displayed.    Estimated Creatinine Clearance: 90.9 mL/min (A) (by C-G formula based on SCr of 1.35 mg/dL (H)).    No Known Allergies  Antimicrobials this admission: Zosyn 9/11 >>9/13; 9/29>> Vanc 9/6 >> 9/7; 9/13 >>9/28; 9/29 >> Rocephin 9/5 >> 9/10 Ceftaz 9/13 >>9/16 Cefepime 9/16 >>9/28  Microbiology results: 9/29 Bcx: sent 9/29 Sputum: sent 9/17 Bcx: neg 9/10 TA: MRSA (S: vanc) + Serratia (R-Ancef, S: cefepime) 9/5 Sputum: mod GPCs - moraxella catarrhalis 8/31 MRSA PCR: negative  Richardine Service, PharmD PGY1 Pharmacy Resident Phone: [phone] 10/29/2018  2:22 PM  Please check [email or web address] for unit-specific pharmacy  phone numbers.
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
## 2018-10-30 DIAGNOSIS — W3400XA Accidental discharge from unspecified firearms or gun, initial encounter: Secondary | ICD-10-CM

## 2018-10-30 DIAGNOSIS — R001 Bradycardia, unspecified: Secondary | ICD-10-CM

## 2018-10-30 LAB — GLUCOSE, CAPILLARY
Glucose-Capillary: 135 mg/dL — ABNORMAL HIGH (ref 70–99)
Glucose-Capillary: 142 mg/dL — ABNORMAL HIGH (ref 70–99)
Glucose-Capillary: 142 mg/dL — ABNORMAL HIGH (ref 70–99)
Glucose-Capillary: 130 mg/dL — ABNORMAL HIGH (ref 70–99)
Glucose-Capillary: 143 mg/dL — ABNORMAL HIGH (ref 70–99)
Glucose-Capillary: 143 mg/dL — ABNORMAL HIGH (ref 70–99)

## 2018-10-30 LAB — POCT I-STAT 7, (LYTES, BLD GAS, ICA,H+H)
Acid-Base Excess: 1 mmol/L (ref 0.0–2.0)
Bicarbonate: 28.1 mmol/L — ABNORMAL HIGH (ref 20.0–28.0)
Calcium, Ion: 1.32 mmol/L (ref 1.15–1.40)
HCT: 22 % — ABNORMAL LOW (ref 39.0–52.0)
Hemoglobin: 7.5 g/dL — ABNORMAL LOW (ref 13.0–17.0)
O2 Saturation: 80 %
Patient temperature: 99.9
Potassium: 5.2 mmol/L — ABNORMAL HIGH (ref 3.5–5.1)
Sodium: 155 mmol/L — ABNORMAL HIGH (ref 135–145)
TCO2: 30 mmol/L (ref 22–32)
pCO2 arterial: 63.7 mmHg — ABNORMAL HIGH (ref 32.0–48.0)
pH, Arterial: 7.256 — ABNORMAL LOW (ref 7.350–7.450)
pO2, Arterial: 54 mmHg — ABNORMAL LOW (ref 83.0–108.0)

## 2018-10-30 LAB — CBC
HCT: 25.8 % — ABNORMAL LOW (ref 39.0–52.0)
Hemoglobin: 7.3 g/dL — ABNORMAL LOW (ref 13.0–17.0)
MCH: 29.7 pg (ref 26.0–34.0)
MCHC: 28.3 g/dL — ABNORMAL LOW (ref 30.0–36.0)
MCV: 104.9 fL — ABNORMAL HIGH (ref 80.0–100.0)
Platelets: 137 10*3/uL — ABNORMAL LOW (ref 150–400)
RBC: 2.46 MIL/uL — ABNORMAL LOW (ref 4.22–5.81)
RDW: 15.2 % (ref 11.5–15.5)
WBC: 9.6 10*3/uL (ref 4.0–10.5)
nRBC: 0 % (ref 0.0–0.2)

## 2018-10-30 LAB — BASIC METABOLIC PANEL
Anion gap: 6 (ref 5–15)
BUN: 74 mg/dL — ABNORMAL HIGH (ref 6–20)
CO2: 27 mmol/L (ref 22–32)
Calcium: 8 mg/dL — ABNORMAL LOW (ref 8.9–10.3)
Chloride: 122 mmol/L — ABNORMAL HIGH (ref 98–111)
Creatinine, Ser: 1.77 mg/dL — ABNORMAL HIGH (ref 0.61–1.24)
GFR calc Af Amer: 55 mL/min — ABNORMAL LOW (ref >60)
GFR calc non Af Amer: 47 mL/min — ABNORMAL LOW (ref >60)
Glucose, Bld: 132 mg/dL — ABNORMAL HIGH (ref 70–99)
Potassium: 4.9 mmol/L (ref 3.5–5.1)
Sodium: 155 mmol/L — ABNORMAL HIGH (ref 135–145)

## 2018-10-30 MED ORDER — DOPAMINE-DEXTROSE 3.2-5 MG/ML-% IV SOLN
0.0000 ug/kg/min | INTRAVENOUS | Status: DC
  Administered 2018-10-30: 2 ug/kg/min via INTRAVENOUS
  Filled 2018-10-30: qty 250

## 2018-10-30 NOTE — Progress Notes (Signed)
RT note-Recruitment performed, current sp02 86%, MD aware.
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
## 2018-10-30 NOTE — Progress Notes (Signed)
RT Note:  Recruitment performed.

## 2018-10-30 NOTE — Progress Notes (Signed)
Pt had asystole event at 0459 lasting approximately 45 seconds. 1MG  atropine administered. Patient heart responded but continued to hold low oxygen saturation- in the 70's. RT notified, patient rebounded without any further intervention.  RN will continue to monitor.

## 2018-10-30 NOTE — Progress Notes (Signed)
RT note-Called to patient sroom for sp02 84%, recruitment performed, Dr. Grandville Silos called, RN in room. Ventilator settings changed to PVC, sp02 88-90%. Continue to monitor.

## 2018-10-30 NOTE — Progress Notes (Signed)
This note also relates to the following rows which could not be included:  SpO2 - Cannot attach notes to unvalidated device data

## 2018-10-30 NOTE — Progress Notes (Signed)
Pts mother advised to come to hospital for pts worsening oxygenation. Pts father was notified as well.

## 2018-10-30 NOTE — Progress Notes (Signed)
RT note- ABG result shown to Dr. Grandville Silos, changed vent mode back to [facility], sp02 now 76%.
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
## 2018-10-30 NOTE — Progress Notes (Signed)
Patient ID: Tony Stephenson, male   DOB: [birth year], 38 y.o.   MRN: [id number] I met with his mother at the bedside and updated her on his latest bradycardic arrest and very recalcitrant hypoxia despite multiple vent changes. We discussed that despite all of our efforts, Tony Stephenson is dying. She is again considering DNR and transition to comfort care now that Tony Stephenson cannot express his wishes. She is going to D/W his father.  I also spoke with Dr. Curt Bears from [facility] and appreciate their recommendation for dopamine.  Georganna Skeans, MD, MPH, FACS Trauma & General Surgery: [phone]

## 2018-10-30 NOTE — Consult Note (Signed)
[facility] Nurse wound follow up Patient receiving care in [place]. Patient continues to asystolic events and low pulse ox saturations.  WOC is signing off on wound follow up.  These attempts have been unsuccessful for weeks due to circumstances listed above. Val Riles, RN, MSN, CWOCN, CNS-BC, pager [phone]

## 2018-10-30 NOTE — Progress Notes (Signed)
Pt satting mid 65s. RT to bedside, MD notified.

## 2018-10-30 NOTE — Progress Notes (Signed)
Patient ID: Tony Stephenson, male   DOB: [birth year], 38 y.o.   MRN: [id number] Desat again despite vent changes. Increase PEEP to 8, another recruitmant maneuver. Plan further goals of care discussion when his mother arrives.  Georganna Skeans, MD, MPH, FACS Trauma & General Surgery: [phone]

## 2018-10-30 NOTE — Progress Notes (Signed)
Pt Sats in 70s again for about 10 min after a turn.Pt returned to supine, sutioned, all troubleshooting by RN done. RT called to room to optimize oxygenation.  Several minutes later, pt sats up to low 80s.

## 2018-10-30 NOTE — Progress Notes (Signed)
RT note- called to room again for sp02 82%, Dr. Grandville Silos at bed side, peep increased to 8. Recruitment performed, sp02 85%, ABG ordered.

## 2018-10-30 NOTE — Progress Notes (Signed)
RT note- sp02 76%, called to room, recruitment performed, Mom is at the bedside.

## 2018-10-31 LAB — GLUCOSE, CAPILLARY
Glucose-Capillary: 113 mg/dL — ABNORMAL HIGH (ref 70–99)
Glucose-Capillary: 122 mg/dL — ABNORMAL HIGH (ref 70–99)
Glucose-Capillary: 122 mg/dL — ABNORMAL HIGH (ref 70–99)
Glucose-Capillary: 130 mg/dL — ABNORMAL HIGH (ref 70–99)
Glucose-Capillary: 113 mg/dL — ABNORMAL HIGH (ref 70–99)
Glucose-Capillary: 128 mg/dL — ABNORMAL HIGH (ref 70–99)

## 2018-10-31 LAB — BASIC METABOLIC PANEL
Anion gap: 9 (ref 5–15)
BUN: 78 mg/dL — ABNORMAL HIGH (ref 6–20)
CO2: 24 mmol/L (ref 22–32)
Calcium: 7.9 mg/dL — ABNORMAL LOW (ref 8.9–10.3)
Chloride: 114 mmol/L — ABNORMAL HIGH (ref 98–111)
Creatinine, Ser: 1.47 mg/dL — ABNORMAL HIGH (ref 0.61–1.24)
GFR calc Af Amer: >60 mL/min (ref >60)
GFR calc non Af Amer: 59 mL/min — ABNORMAL LOW (ref >60)
Glucose, Bld: 322 mg/dL — ABNORMAL HIGH (ref 70–99)
Potassium: 4.5 mmol/L (ref 3.5–5.1)
Sodium: 147 mmol/L — ABNORMAL HIGH (ref 135–145)

## 2018-10-31 LAB — POCT I-STAT 7, (LYTES, BLD GAS, ICA,H+H)
Acid-Base Excess: 2 mmol/L (ref 0.0–2.0)
Bicarbonate: 27.4 mmol/L (ref 20.0–28.0)
Calcium, Ion: 1.31 mmol/L (ref 1.15–1.40)
HCT: 20 % — ABNORMAL LOW (ref 39.0–52.0)
Hemoglobin: 6.8 g/dL — CL (ref 13.0–17.0)
O2 Saturation: 99 %
Patient temperature: 101.7
Potassium: 5 mmol/L (ref 3.5–5.1)
Sodium: 153 mmol/L — ABNORMAL HIGH (ref 135–145)
TCO2: 29 mmol/L (ref 22–32)
pCO2 arterial: 53.9 mmHg — ABNORMAL HIGH (ref 32.0–48.0)
pH, Arterial: 7.323 — ABNORMAL LOW (ref 7.350–7.450)
pO2, Arterial: 141 mmHg — ABNORMAL HIGH (ref 83.0–108.0)

## 2018-10-31 LAB — HEMOGLOBIN AND HEMATOCRIT, BLOOD
HCT: 23.6 % — ABNORMAL LOW (ref 39.0–52.0)
Hemoglobin: 7 g/dL — ABNORMAL LOW (ref 13.0–17.0)

## 2018-10-31 LAB — PREPARE RBC (CROSSMATCH)

## 2018-10-31 LAB — CBC
HCT: 20.9 % — ABNORMAL LOW (ref 39.0–52.0)
Hemoglobin: 5.9 g/dL — CL (ref 13.0–17.0)
MCH: 29.4 pg (ref 26.0–34.0)
MCHC: 28.2 g/dL — ABNORMAL LOW (ref 30.0–36.0)
MCV: 104 fL — ABNORMAL HIGH (ref 80.0–100.0)
Platelets: 134 10*3/uL — ABNORMAL LOW (ref 150–400)
RBC: 2.01 MIL/uL — ABNORMAL LOW (ref 4.22–5.81)
RDW: 14.9 % (ref 11.5–15.5)
WBC: 10.4 10*3/uL (ref 4.0–10.5)
nRBC: 0.2 % (ref 0.0–0.2)

## 2018-10-31 LAB — VANCOMYCIN, TROUGH: Vancomycin Tr: 27 ug/mL (ref 15–20)

## 2018-10-31 MED ORDER — VANCOMYCIN VARIABLE DOSE PER UNSTABLE RENAL FUNCTION (PHARMACIST DOSING): Status: DC

## 2018-10-31 MED ORDER — SODIUM CHLORIDE 0.9 % IV SOLN
2.0000 g | Freq: Three times a day (TID) | INTRAVENOUS | Status: DC
  Administered 2018-10-31 – 2018-11-01 (×3): 2 g via INTRAVENOUS
  Filled 2018-10-31 (×6): qty 2

## 2018-10-31 MED ORDER — ALTEPLASE 2 MG IJ SOLR
2.0000 mg | Freq: Once | INTRAMUSCULAR | Status: AC
  Administered 2018-10-31: 2 mg

## 2018-10-31 NOTE — Progress Notes (Signed)
ET tube cuff had a leak again and RT came to replace air. She said it would eventually need to be changed, paged trauma to update.   Called mother to double check and approve the tube exchange. She asked to wait on the exchange since he wasn't responding as much anymore and there was a high risk of him not surviving another exchange. Received approval from management for family to come in and see him   Updated both mom and dad again about visitation and pt's current condition. Dad is frustrated about the decision making process but will call back with what he wants to do regarding the tube exchange.

## 2018-10-31 NOTE — Progress Notes (Signed)
Patient ID: Tony Stephenson, male   DOB: [birth year], 38 y.o.   MRN: [id number] I met with his mother at the bedside and discussed goals of care. She agrees we should hold off on changing ETT now. She is calling [name]'s father and they will decide on transition to comfort care. I discussed what that process would look like. She will let us know if they want to make him DNR in the interim after she calls his father. His father will likely be coming for , 3h away.  Georganna Skeans, MD, MPH, FACS Trauma & General Surgery: [phone]
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
## 2018-10-31 NOTE — Progress Notes (Signed)
CRITICAL VALUE ALERT  Critical Value:  Hemoglobin 5.9  Date & Time Notied:  0630 10/31/2018  Provider Notified: Trauma MD - Dema Severin  Orders Received/Actions taken: orders for 2 units PRBC's & type/screen

## 2018-11-01 LAB — CULTURE, RESPIRATORY W GRAM STAIN
Gram Stain: NONE SEEN
Special Requests: NORMAL

## 2018-11-01 LAB — TYPE AND SCREEN
ABO/RH(D): O POS
Antibody Screen: NEGATIVE
Unit division: 0
Unit division: 0

## 2018-11-01 LAB — BPAM RBC
Blood Product Expiration Date: 202011032359
Blood Product Expiration Date: 202011032359
ISSUE DATE / TIME: 202010010841
ISSUE DATE / TIME: 202010010841
Unit Type and Rh: 5100
Unit Type and Rh: 5100

## 2018-11-01 LAB — CBC
HCT: 24.9 % — ABNORMAL LOW (ref 39.0–52.0)
Hemoglobin: 7.3 g/dL — ABNORMAL LOW (ref 13.0–17.0)
MCH: 29.8 pg (ref 26.0–34.0)
MCHC: 29.3 g/dL — ABNORMAL LOW (ref 30.0–36.0)
MCV: 101.6 fL — ABNORMAL HIGH (ref 80.0–100.0)
Platelets: 139 10*3/uL — ABNORMAL LOW (ref 150–400)
RBC: 2.45 MIL/uL — ABNORMAL LOW (ref 4.22–5.81)
RDW: 15.7 % — ABNORMAL HIGH (ref 11.5–15.5)
WBC: 9.8 10*3/uL (ref 4.0–10.5)
nRBC: 0 % (ref 0.0–0.2)

## 2018-11-01 LAB — BASIC METABOLIC PANEL
Anion gap: 9 (ref 5–15)
BUN: 94 mg/dL — ABNORMAL HIGH (ref 6–20)
CO2: 24 mmol/L (ref 22–32)
Calcium: 8.3 mg/dL — ABNORMAL LOW (ref 8.9–10.3)
Chloride: 120 mmol/L — ABNORMAL HIGH (ref 98–111)
Creatinine, Ser: 2.13 mg/dL — ABNORMAL HIGH (ref 0.61–1.24)
GFR calc Af Amer: 44 mL/min — ABNORMAL LOW (ref >60)
GFR calc non Af Amer: 38 mL/min — ABNORMAL LOW (ref >60)
Glucose, Bld: 143 mg/dL — ABNORMAL HIGH (ref 70–99)
Potassium: 4.4 mmol/L (ref 3.5–5.1)
Sodium: 153 mmol/L — ABNORMAL HIGH (ref 135–145)

## 2018-11-01 LAB — VANCOMYCIN, RANDOM: Vancomycin Rm: 20

## 2018-11-01 LAB — GLUCOSE, CAPILLARY
Glucose-Capillary: 135 mg/dL — ABNORMAL HIGH (ref 70–99)
Glucose-Capillary: 151 mg/dL — ABNORMAL HIGH (ref 70–99)

## 2018-11-01 MED ORDER — GLYCOPYRROLATE 0.2 MG/ML IJ SOLN
0.2000 mg | INTRAMUSCULAR | Status: DC | PRN
  Administered 2018-11-01: 0.2 mg via INTRAVENOUS
  Filled 2018-11-01: qty 1

## 2018-11-01 MED ORDER — MORPHINE SULFATE (PF) 2 MG/ML IV SOLN: 2.0000 mg | INTRAVENOUS | Status: DC | PRN

## 2018-11-01 MED ORDER — DEXTROSE 5 % IV SOLN: INTRAVENOUS | Status: DC

## 2018-11-01 MED ORDER — GLYCOPYRROLATE 1 MG PO TABS: 1.0000 mg | ORAL_TABLET | ORAL | Status: DC | PRN

## 2018-11-01 MED ORDER — ACETAMINOPHEN 650 MG RE SUPP: 650.0000 mg | Freq: Four times a day (QID) | RECTAL | Status: DC | PRN

## 2018-11-01 MED ORDER — DIPHENHYDRAMINE HCL 50 MG/ML IJ SOLN: 25.0000 mg | INTRAMUSCULAR | Status: DC | PRN

## 2018-11-01 MED ORDER — ACETAMINOPHEN 325 MG PO TABS: 650.0000 mg | ORAL_TABLET | Freq: Four times a day (QID) | ORAL | Status: DC | PRN

## 2018-11-01 MED ORDER — MORPHINE BOLUS VIA INFUSION
5.0000 mg | INTRAVENOUS | Status: DC | PRN
  Filled 2018-11-01: qty 5

## 2018-11-01 MED ORDER — LORAZEPAM 2 MG/ML IJ SOLN
2.0000 mg | INTRAMUSCULAR | Status: DC | PRN
  Filled 2018-11-01: qty 1

## 2018-11-01 MED ORDER — POLYVINYL ALCOHOL 1.4 % OP SOLN
1.0000 [drp] | Freq: Four times a day (QID) | OPHTHALMIC | Status: DC | PRN
  Filled 2018-11-01: qty 15

## 2018-11-01 MED ORDER — HALOPERIDOL LACTATE 5 MG/ML IJ SOLN: 2.5000 mg | INTRAMUSCULAR | Status: DC | PRN

## 2018-11-01 MED ORDER — MORPHINE 100MG IN NS 100ML (1MG/ML) PREMIX INFUSION
0.0000 mg/h | INTRAVENOUS | Status: DC
  Administered 2018-11-01: 5 mg/h via INTRAVENOUS
  Filled 2018-11-01: qty 100

## 2018-11-01 MED ORDER — GLYCOPYRROLATE 0.2 MG/ML IJ SOLN: 0.2000 mg | INTRAMUSCULAR | Status: DC | PRN

## 2018-11-03 LAB — CULTURE, BLOOD (ROUTINE X 2)
Culture: NO GROWTH
Culture: NO GROWTH
Special Requests: ADEQUATE
Special Requests: ADEQUATE

## 2018-11-04 ENCOUNTER — Encounter (HOSPITAL_COMMUNITY): Payer: Self-pay | Admitting: Emergency Medicine
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
## 2018-12-01 NOTE — Procedures (Signed)
Extubation Procedure Note  Patient Details:   Name: Tony Stephenson DOB: 08-26-1980 MRN: [id number]   Airway Documentation:  Pt extubated per withdrawal of life support orders. Vent end date: 2018-11-05 Vent end time: 1224   Evaluation  O2 sats: stable throughout Complications: No apparent complications Patient did tolerate procedure well. Bilateral Breath Sounds: Diminished   No  Ander Purpura 11-05-2018, 12:25 PM

## 2018-12-01 NOTE — Progress Notes (Signed)
Patient ID: Tony Stephenson, male   DOB: [birth year], 38 y.o.   MRN: [id number] I met with his mother, father, and step-mother at the bedside. We had a detailed goals of care discussion and they have decided to transition to comfort care with one-way extubation. This is an appropriate decision.  Georganna Skeans, MD, MPH, FACS Trauma & General Surgery: [phone]

## 2018-12-01 NOTE — Progress Notes (Signed)
Chaplain was with family for support at end of life.  Brion Aliment Chaplain Resident For questions concerning this note please contact me by pager [phone]
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
## 2018-12-01 NOTE — Progress Notes (Signed)
Referred by nurse to see Leonard's family (mom - Stanton Kidney, dad- Scott, and step-mom Otila Kluver) at his bedside.  Family is currently at a crossroads in making a decision to continue resuscitating [place] and prolonging his life and care, or deciding to proceed to comfort care.  They all affirmed that this was a decision they do not want to make.  Family felt like they could not make a decision tonight.  Dad expressed being too tired after driving from [facility] to make any decision.  Step-mom stated that her and Dad should be in town at least until Thursday. Family expressed wanting to honor Miroslav's previous wishes to be resuscitated, but also acknowledged his current state and lack of consistent coherence which has put them in a space of having to make a decision.  They were able to adequately verbalize Arcenio's condition and their choices concerning him.    During visit Otha did open his eyes and responded to step-mom's question, "Do you know what we have been discussing?' (Concerning his life care) with a head nod, signifying "yes."  Brittany did not answer any other questions after that concerning life-prolonging measures.    Chaplain offered spiritual presence, support, and prayer. Chaplain provided education on palliative care and suggested palliative care as a great resource to the family during this time.   Unit chaplain will follow-up.

## 2018-12-01 NOTE — Progress Notes (Signed)
Entered chart for [employer] QI data 

## 2018-12-01 NOTE — Progress Notes (Signed)
The chaplain visited with the patient and the patient's family.  The father of the patient and the step-mother arrived over night and the chaplain visited with them as they prepare for a change in health condition following a necessary procedure.  The chaplain provided supportive conversation and emotional support.  The chaplain will follow this patiently closely throughout the day.  Brion Aliment Chaplain Resident For questions concerning this note please contact me by pager [phone]

## 2018-12-01 NOTE — Progress Notes (Signed)
Pt's family arrived from [facility]. Chaplain services called to speak with family.

## 2018-12-01 DEATH — deceased

## 2020-07-04 IMAGING — DX DG CHEST 1V PORT
1 series · 1 of 1 positions shown · non-contrast
Comparison: 10/05/2018, 10/03/2018, 10/02/2018

CLINICAL DATA: Oxygen desaturation post CPR

EXAM:
PORTABLE CHEST 1 VIEW

[chest ap]
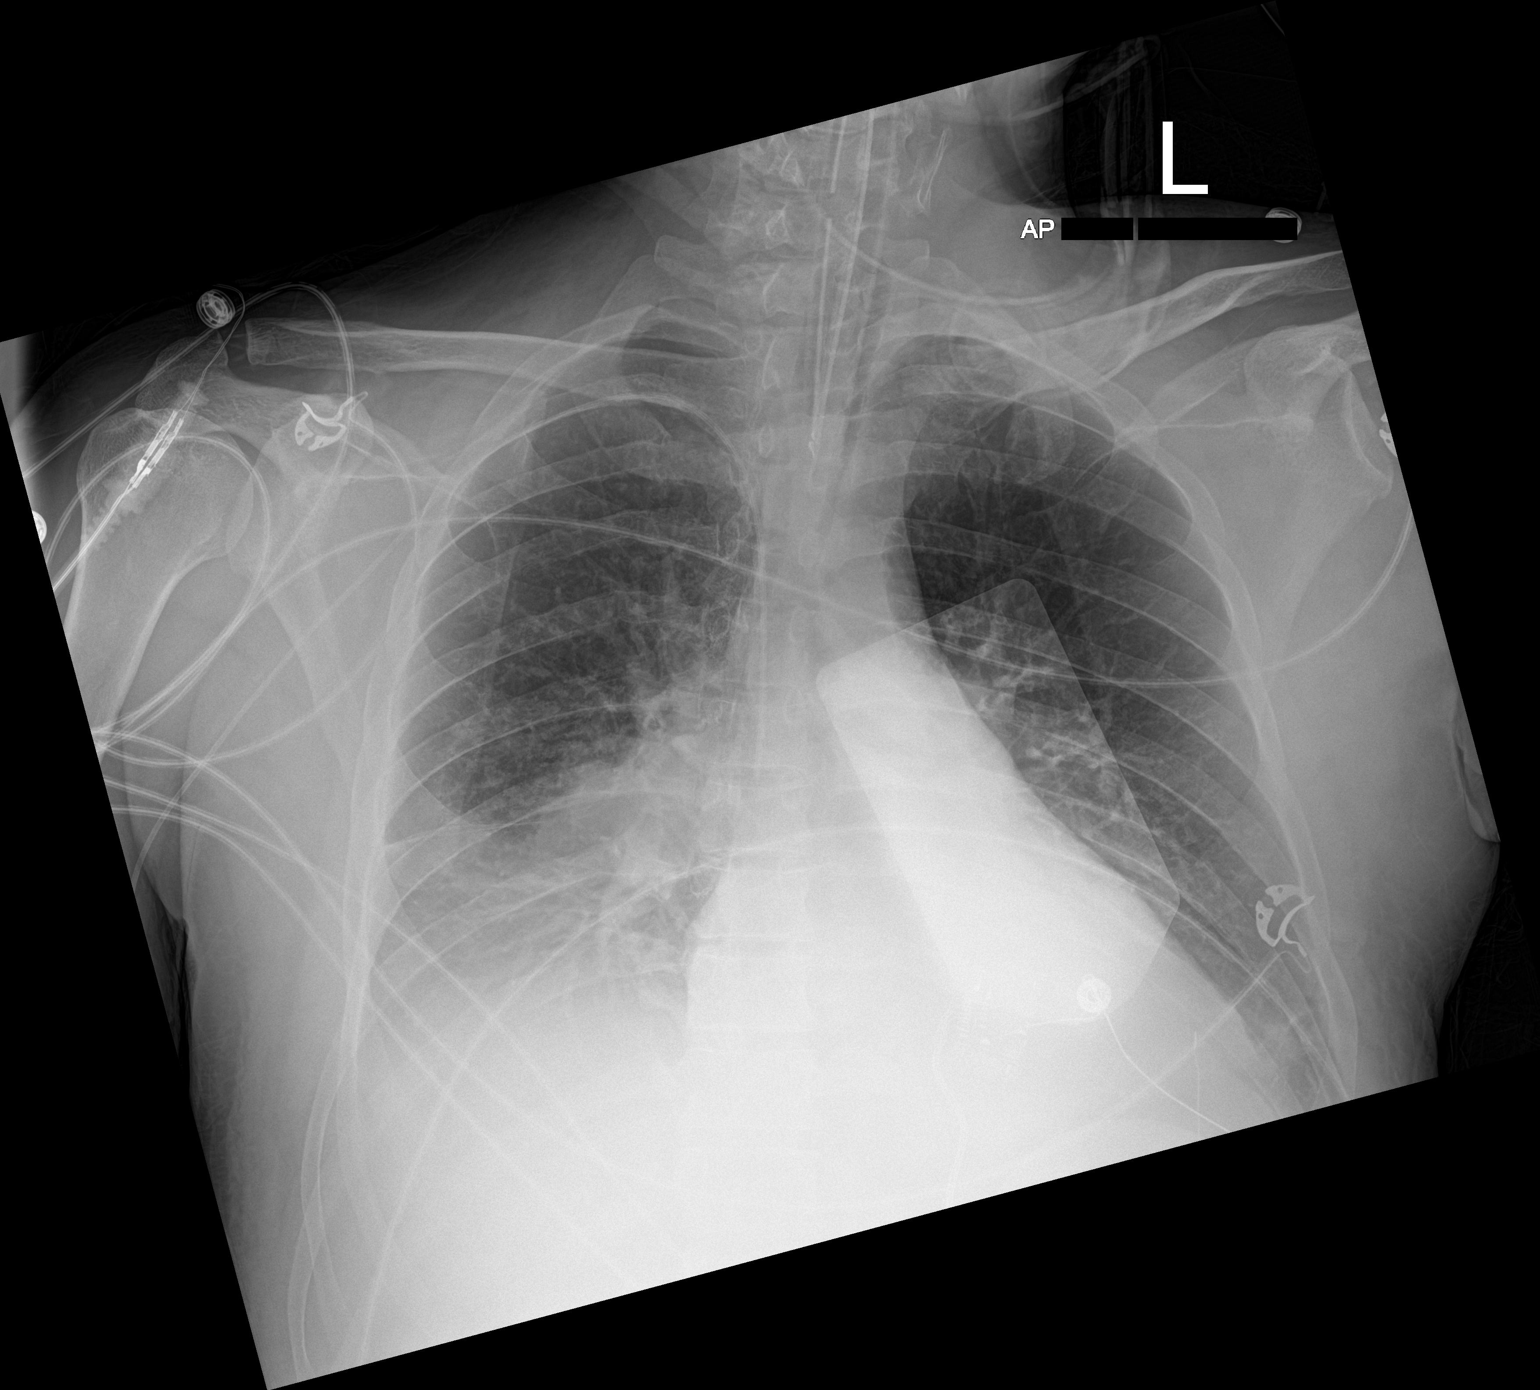

[1 of 1 positions shown; findings below may reference images not displayed]

FINDINGS: There are 2 tubes overlying the upper mediastinum. Both tips are
about 5.5 cm superior to the carina. Right upper extremity catheter
tip over the SVC. Persistent dense airspace disease at the left
base. Worsening airspace disease at the right base with probable
effusion. Stable cardiomediastinal silhouette.
IMPRESSION: 1. There are 2 tubes overlying the upper mediastinum, both tips are
about 5.5 cm superior to the carina. One of the tubes may reflect a
withdrawn esophageal tube as there appears to be a side port above
the thoracic inlet, correlate for tube placement.
2. Worsening airspace disease at the right base. Probable small
right effusion
3. No change in dense left lung base airspace disease.

These results will be called to the ordering clinician or
representative by the Radiologist Assistant, and communication
documented in the PACS or zVision Dashboard.
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
## 2020-07-08 IMAGING — DX DG CHEST 1V PORT
1 series · 2 of 2 positions shown · non-contrast
Comparison: Chest x-ray 10/10/2018.

CLINICAL DATA: 37-year-old male with history of pneumonia.

EXAM:
PORTABLE CHEST 1 VIEW

[Series 1: chest · 0.14mm/px · 2 of 2 slices shown]
[im 1/2]
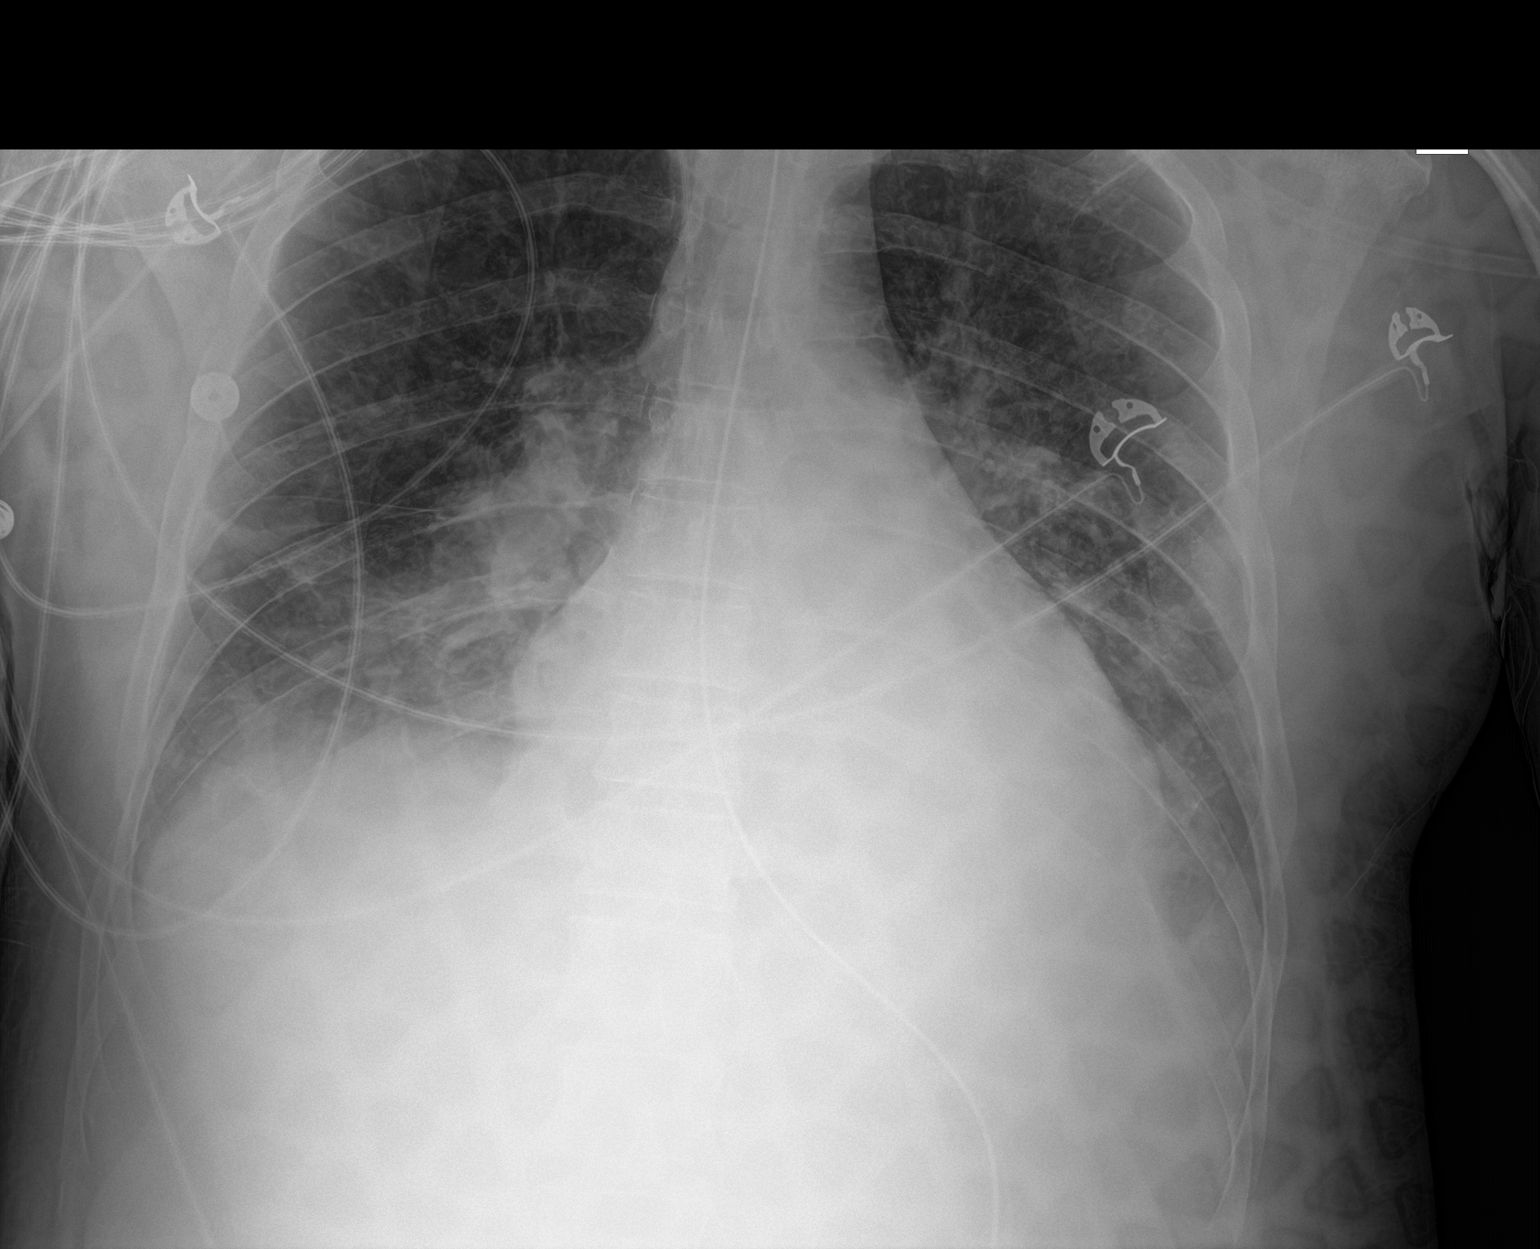
[im 2/2]
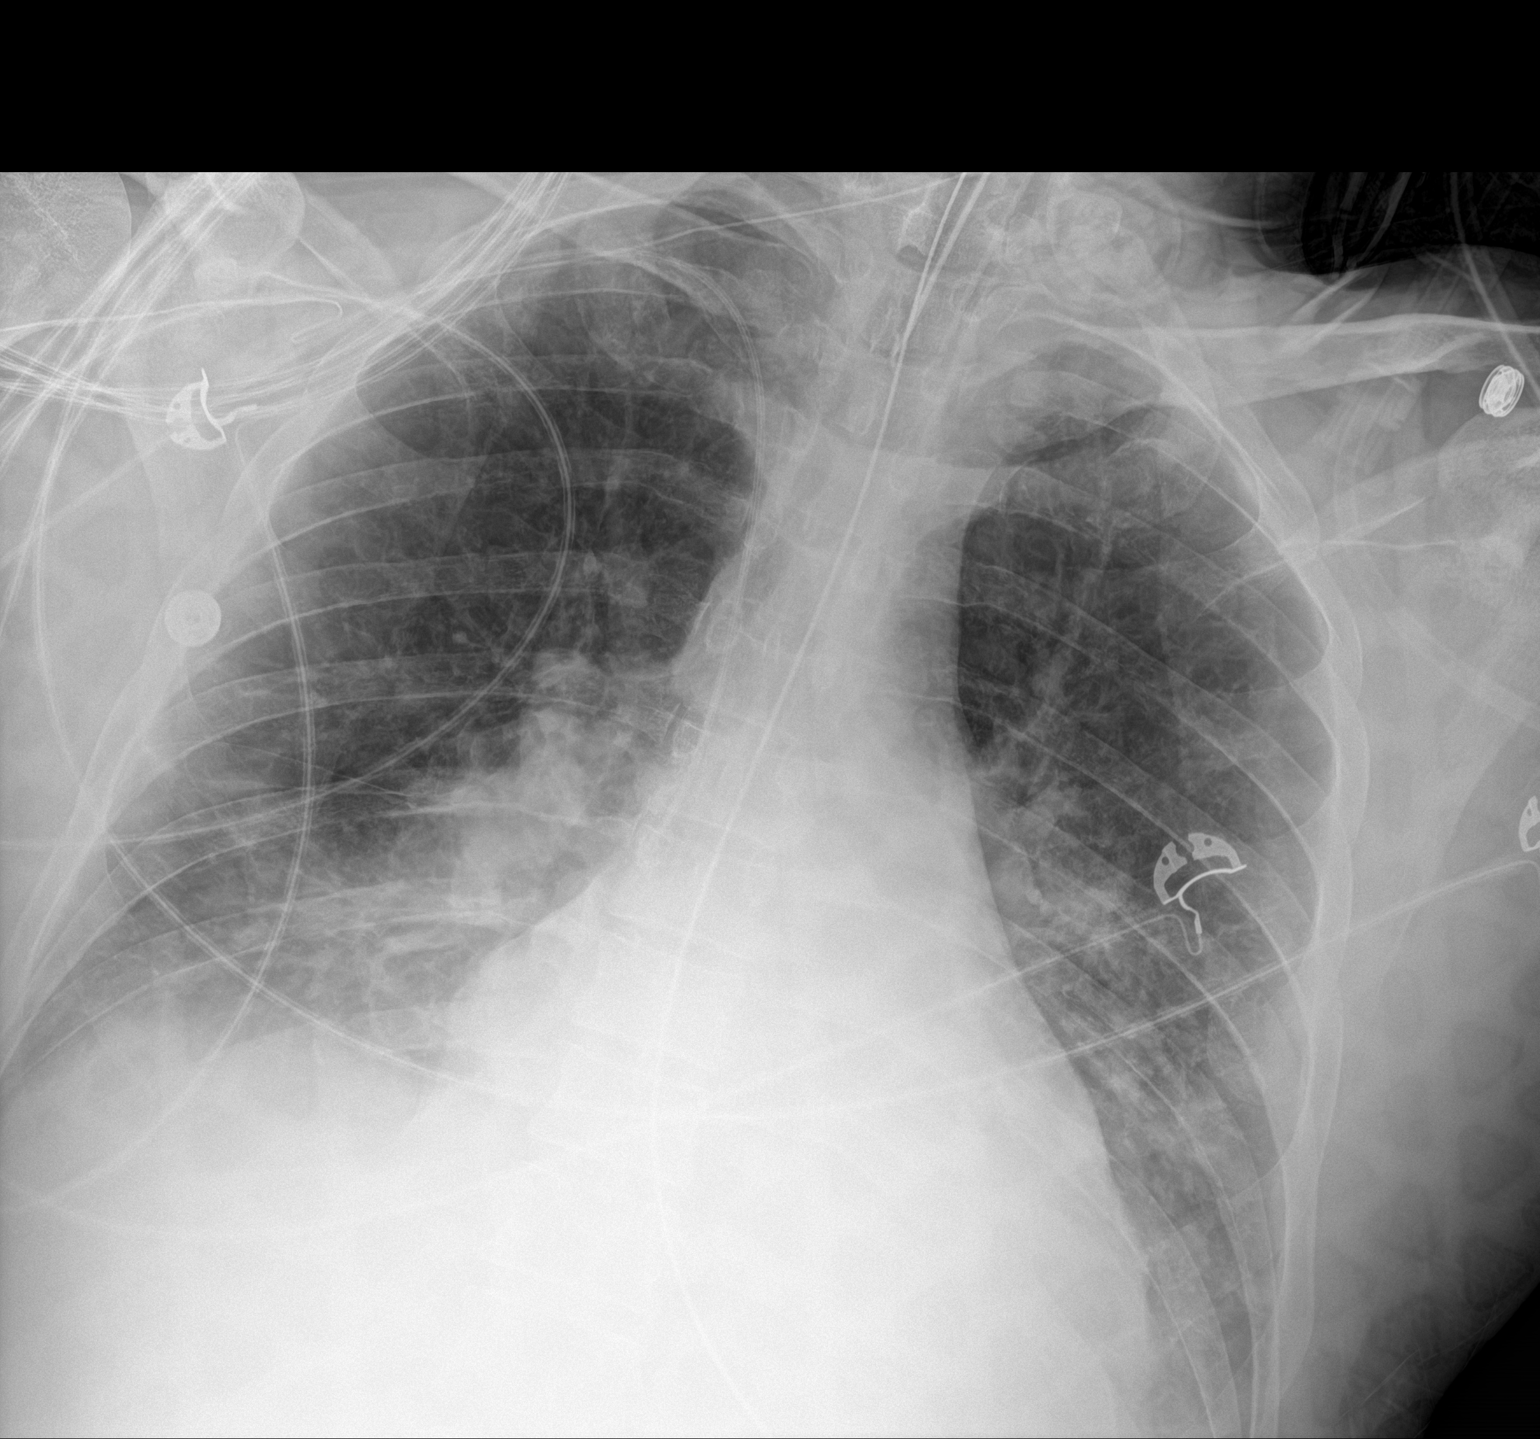

[2 of 2 positions shown; findings below may reference images not displayed]

FINDINGS: An endotracheal tube is in place with tip 8.7 cm above the carina.
There is a right upper extremity PICC with tip terminating in the
superior cavoatrial junction. Patchy multifocal airspace
consolidation, most confluent throughout the mid to lower lungs
bilaterally. Small bilateral pleural effusions. No evidence of
pulmonary edema. Heart size is borderline enlarged. Upper
mediastinal contours are within normal limits.
IMPRESSION: 1. Support apparatus, as above.
2. Multilobar bilateral pneumonia with small bilateral pleural
effusions.
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
